# Patient Record
Sex: Female | Born: 1985 | Race: Black or African American | Hispanic: No | Marital: Single | Smoking: Never smoker
Health system: Southern US, Community
[De-identification: ages and names within clinical notes are randomized; demographics above are authoritative.]

## PROBLEM LIST (undated history)

## (undated) ENCOUNTER — Ambulatory Visit (HOSPITAL_COMMUNITY): Admission: EM | Payer: Medicaid Other

## (undated) DIAGNOSIS — Z789 Other specified health status: Secondary | ICD-10-CM

## (undated) HISTORY — PX: OTHER SURGICAL HISTORY: SHX169

## (undated) HISTORY — PX: TONSILLECTOMY: SUR1361

---

## 2008-04-12 ENCOUNTER — Emergency Department (HOSPITAL_COMMUNITY): Admission: EM | Admit: 2008-04-12 | Discharge: 2008-04-12 | Payer: Self-pay | Admitting: Emergency Medicine

## 2009-03-24 ENCOUNTER — Ambulatory Visit (HOSPITAL_COMMUNITY): Admission: RE | Admit: 2009-03-24 | Discharge: 2009-03-24 | Payer: Self-pay | Admitting: Orthopaedic Surgery

## 2010-08-17 ENCOUNTER — Encounter: Payer: Self-pay | Admitting: Maternal & Fetal Medicine

## 2017-12-24 NOTE — L&D Delivery Note (Signed)
Patient is a 32 y.o. now G4P3 s/p NSVD at [redacted]w[redacted]d, who was admitted for SOL.  She progressed without augmentation with AROM at complete dilation and pushed 9 minutes to deliver.  Cord clamping delayed by 30 minutes per patient request then clamped by CNM and cut by FOB.  Placenta intact and spontaneous, bleeding minimal delivered while cord was still attached.  Mom and baby stable prior to transfer to postpartum. She plans on breastfeeding. She is unsure for birth control.  Delivery Note At 11:29 AM a viable and healthy female was delivered via Vaginal, Spontaneous (Presentation: ROA ).  APGAR: 8, 9; weight pending .   Placenta intact and spontaneous, bleeding minimal delivered while cord was still attached. 3V Cord:with no complications.  Anesthesia: Nitrous  Episiotomy: None Lacerations: None Suture Repair: None Est. Blood Loss (mL): 50  Mom to postpartum.  Baby to Couplet care / Skin to Skin.  Sharyon Cable CNM 05/06/2018, 11:58 AM  Please schedule this patient for Postpartum visit in: 4 weeks with the following provider: Any provider For C/S patients schedule nurse incision check in weeks 2 weeks: no Low risk pregnancy complicated by: nothing Delivery mode:  SVD Anticipated Birth Control:  other/unsure PP Procedures needed: none  Schedule Integrated BH visit: no

## 2018-04-30 ENCOUNTER — Inpatient Hospital Stay (HOSPITAL_COMMUNITY)
Admission: AD | Admit: 2018-04-30 | Discharge: 2018-04-30 | Disposition: A | Discharge status: Home / Self Care | Payer: Medicaid - Out of State | Source: Ambulatory Visit | Attending: Obstetrics and Gynecology | Admitting: Obstetrics and Gynecology

## 2018-04-30 ENCOUNTER — Encounter (HOSPITAL_COMMUNITY): Payer: Self-pay

## 2018-04-30 DIAGNOSIS — K625 Hemorrhage of anus and rectum: Secondary | ICD-10-CM | POA: Diagnosis present

## 2018-04-30 DIAGNOSIS — O2243 Hemorrhoids in pregnancy, third trimester: Secondary | ICD-10-CM | POA: Diagnosis not present

## 2018-04-30 DIAGNOSIS — Z3A38 38 weeks gestation of pregnancy: Secondary | ICD-10-CM | POA: Diagnosis not present

## 2018-04-30 DIAGNOSIS — O26893 Other specified pregnancy related conditions, third trimester: Secondary | ICD-10-CM | POA: Diagnosis present

## 2018-04-30 DIAGNOSIS — K644 Residual hemorrhoidal skin tags: Secondary | ICD-10-CM

## 2018-04-30 HISTORY — DX: Other specified health status: Z78.9

## 2018-04-30 MED ORDER — DIPHENHYDRAMINE HCL 50 MG/ML IJ SOLN
INTRAMUSCULAR | Status: AC
  Filled 2018-04-30: qty 1

## 2018-04-30 MED ORDER — OXYCODONE-ACETAMINOPHEN 5-325 MG PO TABS: 1.0000 | ORAL_TABLET | ORAL | 0 refills | Status: DC | PRN

## 2018-04-30 MED ORDER — BETAMETHASONE VALERATE 0.12 % EX FOAM
1.0000 | Freq: Two times a day (BID) | CUTANEOUS | 0 refills | Status: DC
Start: 2018-04-30 — End: 2020-06-30

## 2018-04-30 MED ORDER — OXYCODONE-ACETAMINOPHEN 5-325 MG PO TABS
1.0000 | ORAL_TABLET | Freq: Once | ORAL | Status: AC
  Administered 2018-04-30: 1 via ORAL
  Filled 2018-04-30: qty 1

## 2018-04-30 MED ORDER — NITROGLYCERIN 0.4 % RE OINT: 1.0000 | TOPICAL_OINTMENT | Freq: Two times a day (BID) | RECTAL | 0 refills | Status: DC | PRN

## 2018-04-30 NOTE — Discharge Instructions (Signed)
Hemorrhoids    Hemorrhoids are swollen veins in and around the rectum or anus. Hemorrhoids can cause pain, itching, or bleeding. Most of the time, they do not cause serious problems. They usually get better with diet changes, lifestyle changes, and other home treatments.  Follow these instructions at home:  Eating and drinking  · Eat foods that have fiber, such as whole grains, beans, nuts, fruits, and vegetables. Ask your doctor about taking products that have added fiber (fiber supplements).  · Drink enough fluid to keep your pee (urine) clear or pale yellow.  For Pain and Swelling  · Take a warm-water bath (sitz bath) for 20 minutes to ease pain. Do this 3-4 times a day.  · If directed, put ice on the painful area. It may be helpful to use ice between your warm baths.  ¨ Put ice in a plastic bag.  ¨ Place a towel between your skin and the bag.  ¨ Leave the ice on for 20 minutes, 2-3 times a day.  General instructions  · Take over-the-counter and prescription medicines only as told by your doctor.  ¨ Medicated creams and medicines that are inserted into the anus (suppositories) may be used or applied as told.  · Exercise often.  · Go to the bathroom when you have the urge to poop (to have a bowel movement). Do not wait.  · Avoid pushing too hard (straining) when you poop.  · Keep the butt area dry and clean. Use wet toilet paper or moist paper towels.  · Do not sit on the toilet for a long time.  Contact a doctor if:  · You have any of these:  ¨ Pain and swelling that do not get better with treatment or medicine.  ¨ Bleeding that will not stop.  ¨ Trouble pooping or you cannot poop.  ¨ Pain or swelling outside the area of the hemorrhoids.  This information is not intended to replace advice given to you by your health care provider. Make sure you discuss any questions you have with your health care provider.  Document Released: 09/18/2008 Document Revised: 05/17/2016 Document Reviewed: 08/24/2015  Elsevier  Interactive Patient Education © 2018 [employer]   

## 2018-04-30 NOTE — MAU Note (Addendum)
G4P2 @ 38. Wksga. Here dt rectal pain with bleeding. Was seen in the office and was given hemmoroid cream and tuxs, witch hazel. Denies LOF or bleeding or abd pain.   EFM applied.  2020: provider at bs assessing. Hemorrhoid assessed. Two large Hemorrhoids  noted.   2047: medicated per order   2120: no allergic rxn to po med.   2125: D/c instructions given with pt understanding. Instructed pt to not to eat past midnight just incase put to sleep for procedure next day. Pt verbalizes understanding.

## 2018-04-30 NOTE — MAU Provider Note (Signed)
History     CSN: [id number]  Arrival date and time: 04/30/18 4098   First Provider Initiated Contact with Patient 04/30/18 2015      Chief Complaint  Patient presents with  . Hemorrhoids    bleeding    HPI   [name] is a 32 y.o. female [phone] @ [redacted]w[redacted]d here in MAU with rectal bleeding and painful rectal hemorrhoids . Says she noticed them on Sunday; says that the pain has gotten progressively worse this week. Says she tried tucks, witch hazel, hydrocortisone, and a few other over the counter medications that has not helped at all. Says she has a hard time sleeping and sitting down due to the pain in her rectum. Says she has noticed a few dark red clots in the toilet from her rectum. No vaginal bleeding, + fetal movement.   OB History    Gravida  4   Para  2   Term  2   Preterm  0   AB  1   Living  2     SAB  1   TAB  0   Ectopic  0   Multiple  0   Live Births  2           Past Medical History:  Diagnosis Date  . Medical history non-contributory     Past Surgical History:  Procedure Laterality Date  . TONSILLECTOMY      Family History  Problem Relation Age of Onset  . COPD Maternal Grandmother   . Diabetes Maternal Grandmother     Social History   Tobacco Use  . Smoking status: Never Smoker  . Smokeless tobacco: Former Engineer, water Use Topics  . Alcohol use: Not Currently  . Drug use: Not Currently    Types: Marijuana    Allergies:  Allergies  Allergen Reactions  . Codeine Hives  . Amoxicillin Rash  . Penicillins Rash    No medications prior to admission.   No results found for this or any previous visit (from the past 48 hour(s)).  Review of Systems  Constitutional: Negative for fever.  Gastrointestinal: Positive for constipation (Was constipation Sunday/ Monday).  Genitourinary: Negative for vaginal bleeding and vaginal discharge.   Physical Exam   Blood pressure 119/79, pulse 83, temperature 98.5 F (36.9 C),  temperature source Oral, resp. rate 20, height  (1.676 m), weight 156 lb (70.8 kg), SpO2 98 %.  Physical Exam  Constitutional: She is oriented to person, place, and time. She appears well-developed and well-nourished. She appears distressed.  HENT:  Head: Normocephalic.  GI: Soft.  Genitourinary: Rectal exam shows external hemorrhoid and tenderness.  Genitourinary Comments: 2 non- thrombosed hemorrhoids noted. Small opening with oozing of blood noted from opening. Significant tenderness.   Musculoskeletal: Normal range of motion.  Neurological: She is alert and oriented to person, place, and time.  Skin: Skin is warm.  Psychiatric: Her behavior is normal.   Fetal Tracing: Baseline: 120 bpm Variability: Moderate  Accelerations: 15x15 Decelerations: None Toco: None  MAU Course  Procedures  None  MDM  Percocet given 1 tab PO; patient tolerated well  Discussed patient with Dr. Jolayne Panther including HPI, PE. Plan to consult with General Surgery for out patient management.   Assessment and Plan   A:  Hemorrhoids during pregnancy in third trimester - Plan: Consult to general surgery  Bleeding external hemorrhoids - Plan: Consult to general surgery   P:  Discharge home with strict return precautions If symptoms  worsen go to [facility] or [facility] Rx: Nitro cream external, percocet Outpatient referral to General surgery for management Return to MAU for OB reasons  Kim Salazar, Kim Dike I, NP 05/02/2018 11:18 AM

## 2018-04-30 NOTE — MAU Note (Signed)
Pt reports hemorrhoids and rectal bleeding that started Sunday/Monday, but worse throughout the week. Has tried witch hazel, tucks pads, RX, nothing helps. Last BM was this morning-was normal; had drank some prune juice, which seems to soften to stools, but the pain is worse. Pt is very tearful in triage. Pt denies LOF or vaginal bleeding. Reports some contractions. Reports good fetal movement.

## 2018-05-06 ENCOUNTER — Inpatient Hospital Stay (HOSPITAL_COMMUNITY)
Admission: AD | Admit: 2018-05-06 | Discharge: 2018-05-07 | DRG: 807 | Disposition: A | Discharge status: Home / Self Care | Payer: Medicaid - Out of State | Source: Ambulatory Visit | Attending: Family Medicine | Admitting: Family Medicine

## 2018-05-06 ENCOUNTER — Other Ambulatory Visit: Payer: Self-pay

## 2018-05-06 ENCOUNTER — Encounter (HOSPITAL_COMMUNITY): Payer: Self-pay

## 2018-05-06 DIAGNOSIS — Z3483 Encounter for supervision of other normal pregnancy, third trimester: Secondary | ICD-10-CM | POA: Diagnosis present

## 2018-05-06 DIAGNOSIS — O0930 Supervision of pregnancy with insufficient antenatal care, unspecified trimester: Secondary | ICD-10-CM

## 2018-05-06 DIAGNOSIS — O0933 Supervision of pregnancy with insufficient antenatal care, third trimester: Secondary | ICD-10-CM

## 2018-05-06 DIAGNOSIS — Z880 Allergy status to penicillin: Secondary | ICD-10-CM | POA: Diagnosis not present

## 2018-05-06 DIAGNOSIS — Z3A38 38 weeks gestation of pregnancy: Secondary | ICD-10-CM

## 2018-05-06 DIAGNOSIS — Z91040 Latex allergy status: Secondary | ICD-10-CM

## 2018-05-06 LAB — POCT FERN TEST: POCT Fern Test: NEGATIVE

## 2018-05-06 LAB — RAPID HIV SCREEN (HIV 1/2 AB+AG)
HIV 1/2 Antibodies: NONREACTIVE
HIV-1 P24 Antigen - HIV24: NONREACTIVE

## 2018-05-06 LAB — CBC
HCT: 41.1 % (ref 36.0–46.0)
Hemoglobin: 14 g/dL (ref 12.0–15.0)
MCH: 30.6 pg (ref 26.0–34.0)
MCHC: 34.1 g/dL (ref 30.0–36.0)
MCV: 89.9 fL (ref 78.0–100.0)
Platelets: 146 10*3/uL — ABNORMAL LOW (ref 150–400)
RBC: 4.57 MIL/uL (ref 3.87–5.11)
RDW: 14.3 % (ref 11.5–15.5)
WBC: 11.8 10*3/uL — ABNORMAL HIGH (ref 4.0–10.5)

## 2018-05-06 LAB — TYPE AND SCREEN
ABO/RH(D): AB POS
Antibody Screen: NEGATIVE

## 2018-05-06 LAB — HEPATITIS B SURFACE ANTIGEN: Hepatitis B Surface Ag: NEGATIVE

## 2018-05-06 LAB — GROUP B STREP BY PCR: Group B strep by PCR: NEGATIVE

## 2018-05-06 LAB — ABO/RH: ABO/RH(D): AB POS

## 2018-05-06 MED ORDER — DIBUCAINE 1 % RE OINT: 1.0000 "application " | TOPICAL_OINTMENT | RECTAL | Status: DC | PRN

## 2018-05-06 MED ORDER — WITCH HAZEL-GLYCERIN EX PADS: 1.0000 "application " | MEDICATED_PAD | CUTANEOUS | Status: DC | PRN

## 2018-05-06 MED ORDER — OXYTOCIN 40 UNITS IN LACTATED RINGERS INFUSION - SIMPLE MED
2.5000 [IU]/h | INTRAVENOUS | Status: DC
  Filled 2018-05-06: qty 1000

## 2018-05-06 MED ORDER — SENNOSIDES-DOCUSATE SODIUM 8.6-50 MG PO TABS
2.0000 | ORAL_TABLET | ORAL | Status: DC
  Administered 2018-05-06: 2 via ORAL
  Filled 2018-05-06: qty 2

## 2018-05-06 MED ORDER — DIPHENHYDRAMINE HCL 25 MG PO CAPS: 25.0000 mg | ORAL_CAPSULE | Freq: Four times a day (QID) | ORAL | Status: DC | PRN

## 2018-05-06 MED ORDER — SOD CITRATE-CITRIC ACID 500-334 MG/5ML PO SOLN: 30.0000 mL | ORAL | Status: DC | PRN

## 2018-05-06 MED ORDER — COCONUT OIL OIL
1.0000 "application " | TOPICAL_OIL | Status: DC | PRN
  Administered 2018-05-06: 1 via TOPICAL
  Filled 2018-05-06: qty 120

## 2018-05-06 MED ORDER — ONDANSETRON HCL 4 MG/2ML IJ SOLN: 4.0000 mg | INTRAMUSCULAR | Status: DC | PRN

## 2018-05-06 MED ORDER — ACETAMINOPHEN 325 MG PO TABS
650.0000 mg | ORAL_TABLET | ORAL | Status: DC | PRN
Start: 2018-05-06 — End: 2018-05-07
  Administered 2018-05-06 (×2): 650 mg via ORAL
  Filled 2018-05-06 (×2): qty 2

## 2018-05-06 MED ORDER — ONDANSETRON HCL 4 MG PO TABS: 4.0000 mg | ORAL_TABLET | ORAL | Status: DC | PRN

## 2018-05-06 MED ORDER — TETANUS-DIPHTH-ACELL PERTUSSIS 5-2.5-18.5 LF-MCG/0.5 IM SUSP: 0.5000 mL | Freq: Once | INTRAMUSCULAR | Status: DC

## 2018-05-06 MED ORDER — LACTATED RINGERS IV SOLN: 500.0000 mL | INTRAVENOUS | Status: DC | PRN

## 2018-05-06 MED ORDER — ZOLPIDEM TARTRATE 5 MG PO TABS: 5.0000 mg | ORAL_TABLET | Freq: Every evening | ORAL | Status: DC | PRN

## 2018-05-06 MED ORDER — LACTATED RINGERS IV SOLN
INTRAVENOUS | Status: DC
  Administered 2018-05-06: 10:00:00 via INTRAVENOUS

## 2018-05-06 MED ORDER — SIMETHICONE 80 MG PO CHEW
80.0000 mg | CHEWABLE_TABLET | ORAL | Status: DC | PRN
Start: 2018-05-06 — End: 2018-05-07

## 2018-05-06 MED ORDER — LIDOCAINE HCL (PF) 1 % IJ SOLN
30.0000 mL | INTRAMUSCULAR | Status: DC | PRN
  Filled 2018-05-06: qty 30

## 2018-05-06 MED ORDER — ONDANSETRON HCL 4 MG/2ML IJ SOLN: 4.0000 mg | Freq: Four times a day (QID) | INTRAMUSCULAR | Status: DC | PRN

## 2018-05-06 MED ORDER — PRENATAL MULTIVITAMIN CH
1.0000 | ORAL_TABLET | Freq: Every day | ORAL | Status: DC
  Administered 2018-05-06 – 2018-05-07 (×2): 1 via ORAL
  Filled 2018-05-06 (×2): qty 1

## 2018-05-06 MED ORDER — IBUPROFEN 600 MG PO TABS
600.0000 mg | ORAL_TABLET | Freq: Four times a day (QID) | ORAL | Status: DC
  Administered 2018-05-06 – 2018-05-07 (×3): 600 mg via ORAL
  Filled 2018-05-06 (×4): qty 1

## 2018-05-06 MED ORDER — OXYTOCIN BOLUS FROM INFUSION
500.0000 mL | Freq: Once | INTRAVENOUS | Status: AC
  Administered 2018-05-06: 500 mL via INTRAVENOUS

## 2018-05-06 MED ORDER — BENZOCAINE-MENTHOL 20-0.5 % EX AERO
1.0000 "application " | INHALATION_SPRAY | CUTANEOUS | Status: DC | PRN
  Administered 2018-05-06: 1 via TOPICAL
  Filled 2018-05-06: qty 56

## 2018-05-06 MED ORDER — FENTANYL CITRATE (PF) 100 MCG/2ML IJ SOLN: 100.0000 ug | INTRAMUSCULAR | Status: DC | PRN

## 2018-05-06 MED ORDER — ACETAMINOPHEN 325 MG PO TABS
650.0000 mg | ORAL_TABLET | ORAL | Status: DC | PRN
  Administered 2018-05-06: 650 mg via ORAL
  Filled 2018-05-06: qty 2

## 2018-05-06 NOTE — H&P (Addendum)
OBSTETRIC ADMISSION HISTORY AND PHYSICAL  Kim Salazar is a 32 y.o. female [phone] with IUP at [redacted]w[redacted]d by  presenting for SOL. She reports +FMs, No LOF, no VB, no blurry vision, headaches or peripheral edema, and RUQ pain.  She plans on breast feeding. She request depo for birth control. She received her prenatal care at received care in [place] and recently moved to [facility], no prenatal care for last 2 months aside from MAU visiton 5/8   Dating: By LMP --->  Estimated Date of Delivery: 05/14/18  U/s: Normal per patient report, doesn't know specific details, attempting to acquire records from [place]   Prenatal History/Complications:  Past Medical History: Past Medical History:  Diagnosis Date  . Medical history non-contributory     Past Surgical History: Past Surgical History:  Procedure Laterality Date  . adenoid removal    . TONSILLECTOMY      Obstetrical History: OB History    Gravida  4   Para  2   Term  2   Preterm  0   AB  1   Living  2     SAB  1   TAB  0   Ectopic  0   Multiple  0   Live Births  2           Social History: Social History   Socioeconomic History  . Marital status: Single    Spouse name: Not on file  . Number of children: Not on file  . Years of education: Not on file  . Highest education level: Not on file  Occupational History  . Not on file  Social Needs  . Financial resource strain: Not on file  . Food insecurity:    Worry: Not on file    Inability: Not on file  . Transportation needs:    Medical: Not on file    Non-medical: Not on file  Tobacco Use  . Smoking status: Never Smoker  . Smokeless tobacco: Former Engineer, water and Sexual Activity  . Alcohol use: Not Currently  . Drug use: Not Currently    Types: Marijuana  . Sexual activity: Yes    Birth control/protection: None  Lifestyle  . Physical activity:    Days per week: Not on file    Minutes per session: Not on file  . Stress: Not on file   Relationships  . Social connections:    Talks on phone: Not on file    Gets together: Not on file    Attends religious service: Not on file    Active member of club or organization: Not on file    Attends meetings of clubs or organizations: Not on file    Relationship status: Not on file  Other Topics Concern  . Not on file  Social History Narrative  . Not on file    Family History: Family History  Problem Relation Age of Onset  . COPD Maternal Grandmother   . Diabetes Maternal Grandmother     Allergies: Allergies  Allergen Reactions  . Codeine Hives  . Amoxicillin Rash  . Latex Rash  . Penicillins Rash    Has patient had a PCN reaction causing immediate rash, facial/tongue/throat swelling, SOB or lightheadedness with hypotension: Yes Has patient had a PCN reaction causing severe rash involving mucus membranes or skin necrosis: Yes Has patient had a PCN reaction that required hospitalization: No Has patient had a PCN reaction occurring within the last 10 years: No If all of the  above answers are "NO", then may proceed with Cephalosporin use.     Medications Prior to Admission  Medication Sig Dispense Refill Last Dose  . acetaminophen (TYLENOL) 500 MG tablet Take 500 mg by mouth every 6 (six) hours as needed for mild pain or headache.    05/05/2018 at Unknown time  . oxyCODONE-acetaminophen (PERCOCET/ROXICET) 5-325 MG tablet Take 1-2 tablets by mouth every 4 (four) hours as needed for severe pain. 10 tablet 0 05/05/2018 at Unknown time  . Prenatal Vit-Fe Fumarate-FA (PRENATAL MULTIVITAMIN) TABS tablet Take 1 tablet by mouth daily at 12 noon.   05/05/2018 at Unknown time  . Betamethasone Valerate 0.12 % foam Apply 1 application topically 2 (two) times daily. (Patient not taking: Reported on 05/06/2018) 50 g 0 Not Taking at Unknown time  . Nitroglycerin 0.4 % OINT Place 1 Dose rectally 2 (two) times daily as needed. (Patient not taking: Reported on 05/06/2018) 1 Tube 0 Not Taking  at Unknown time     Review of Systems   All systems reviewed and negative except as stated in HPI  Blood pressure 123/72, pulse 71, temperature 98 F (36.7 C), resp. rate (P) 18, height  (1.676 m), weight 71.2 kg (157 lb). General appearance: alert and cooperative Lungs: clear to auscultation bilaterally Heart: regular rate and rhythm Abdomen: soft, non-tender; bowel sounds normal Extremities: Homans sign is negative, no sign of DVT Presentation: cephalic Fetal monitoringBaseline: 130 bpm, Variability: Fair (1-6 bpm), Accelerations: Reactive and Decelerations: Absent Uterine activityFrequency: Every 3-4 minutes Dilation: 5 Effacement (%): 90 Station: -2 Exam by:: Ninfa Meeker, RN   Prenatal labs: ABO, Rh:   pending Antibody:   pending Rubella:   pending RPR:   pending HBsAg:   pending HIV:   pending GBS:   pending 1 hr Glucola normal per patient report Genetic screening  Normal per patient report Anatomy US normal per patient report  Prenatal Transfer Tool  Maternal Diabetes: No Genetic Screening: Normal Maternal Ultrasounds/Referrals: Normal Fetal Ultrasounds or other Referrals:  None Maternal Substance Abuse:  No Significant Maternal Medications:  None Significant Maternal Lab Results: None  Results for orders placed or performed during the hospital encounter of 05/06/18 (from the past 24 hour(s))  POCT fern test   Collection Time: 05/06/18  9:13 AM  Result Value Ref Range   POCT Fern Test Negative = intact amniotic membranes   CBC   Collection Time: 05/06/18  9:25 AM  Result Value Ref Range   WBC 11.8 (H) 4.0 - 10.5 K/uL   RBC 4.57 3.87 - 5.11 MIL/uL   Hemoglobin 14.0 12.0 - 15.0 g/dL   HCT 16.1 09.6 - 04.5 %   MCV 89.9 78.0 - 100.0 fL   MCH 30.6 26.0 - 34.0 pg   MCHC 34.1 30.0 - 36.0 g/dL   RDW 40.9 81.1 - 91.4 %   Platelets 146 (L) 150 - 400 K/uL    Patient Active Problem List   Diagnosis Date Noted  . Normal labor 05/06/2018     Assessment/Plan:  Kim Salazar is a 32 y.o. [id number] at [redacted]w[redacted]d here for SOL. Patient recently moved from [place], all prenatal records from that office are pending. Everything normal per patient report but also reportedly has not received care anywhere in last two months. Will proceed with all prenatal labs until records are received. Has been changing rapidly, expectant management.  #Labor: expectant #Pain: Iv pain meds #FWB: Cat 1 #ID:  gbs pcr negative #MOF: breast #MOC:depo #Circ:  N//A  Myrene Buddy, MD  05/06/2018, 10:12 AM   I confirm that I have verified the information documented in the resident's note and that I have also personally reperformed the physical exam and all medical decision making activities.  Sharyon Cable, CNM 05/06/18, 12:47 PM

## 2018-05-06 NOTE — MAU Note (Signed)
Pt reports contractions, ? Leaking fluid  

## 2018-05-07 ENCOUNTER — Encounter (HOSPITAL_COMMUNITY): Payer: Self-pay | Admitting: *Deleted

## 2018-05-07 ENCOUNTER — Other Ambulatory Visit: Payer: Self-pay

## 2018-05-07 DIAGNOSIS — O0930 Supervision of pregnancy with insufficient antenatal care, unspecified trimester: Secondary | ICD-10-CM

## 2018-05-07 LAB — RPR: RPR Ser Ql: NONREACTIVE

## 2018-05-07 MED ORDER — IBUPROFEN 600 MG PO TABS: 600.0000 mg | ORAL_TABLET | Freq: Four times a day (QID) | ORAL | 0 refills | Status: DC

## 2018-05-07 NOTE — Lactation Note (Signed)
This note was copied from a baby's chart. Lactation Consultation Note  Patient Name: Kim Salazar [id number] Date: 05/07/2018 Reason for consult: Initial assessment  Visited with P3 experienced breastfeeder.  Baby has been latching well, latch scores of 10.   Encouraged STS and feeding baby on cue. Lactation brochure given to Mom.  Mom aware of IP and OP lactation support available.  Encouraged Mom to call for assist as needed.  Maternal Data Formula Feeding for Exclusion: No Has patient been taught Hand Expression?: Yes Does the patient have breastfeeding experience prior to this delivery?: Yes  Feeding Feeding Type: Breast Fed  LATCH Score Latch: Grasps breast easily, tongue down, lips flanged, rhythmical sucking.  Audible Swallowing: Spontaneous and intermittent  Type of Nipple: Everted at rest and after stimulation  Comfort (Breast/Nipple): Soft / non-tender  Hold (Positioning): No assistance needed to correctly position infant at breast.  LATCH Score: 10  Interventions Interventions: Breast feeding basics reviewed;Skin to skin;Hand express;Breast massage  Consult Status Consult Status: Follow-up Date: 05/08/18 Follow-up type: In-patient    Judee Clara 05/07/2018, 12:17 PM

## 2018-05-07 NOTE — Discharge Summary (Signed)
OB Discharge Summary     Patient Name: Kim Salazar DOB: 1986/07/26 MRN: [id number]  Date of admission: 05/06/2018 Delivering MD: Sharyon Cable   Date of discharge: 05/07/2018  Admitting diagnosis: LABOR Intrauterine pregnancy: [redacted]w[redacted]d     Secondary diagnosis:  Active Problems:   Normal labor   SVD (spontaneous vaginal delivery)   Limited prenatal care  Additional problems: none     Discharge diagnosis: Term Pregnancy Delivered                                                                                                Post partum procedures:none  Augmentation: none  Complications: None  Hospital course:  Onset of Labor With Vaginal Delivery     32 y.o. yo [id number] at [redacted]w[redacted]d was admitted in Active Labor on 05/06/2018. Patient had an uncomplicated labor course as follows:  Membrane Rupture Time/Date: 11:18 AM ,05/06/2018   Intrapartum Procedures: Episiotomy: None [1]                                         Lacerations:  None [1]  Patient had a delivery of a Viable infant. 05/06/2018  Information for the patient's newborn:  Asalee, Barrette [[id number]]  Delivery Method: Vaginal, Spontaneous(Filed from Delivery Summary)    Pateint had an uncomplicated postpartum course.  She is ambulating, tolerating a regular diet, passing flatus, and urinating well. Patient is discharged home in stable condition on 05/07/18.   Physical exam  Vitals:   05/06/18 1353 05/06/18 1512 05/06/18 1821 05/07/18 0534  BP: 110/75 101/80 110/73 110/77  Pulse: 66 85 77 67  Resp:  Temp: 98.4 F (36.9 C) 98 F (36.7 C) (!) 97.5 F (36.4 C) 97.6 F (36.4 C)  TempSrc: Oral Oral Oral Axillary  SpO2: 100% 100% 99%   Weight:      Height:       General: alert, cooperative and no distress Lochia: appropriate Uterine Fundus: firm Incision: N/A DVT Evaluation: No evidence of DVT seen on physical exam. Labs: Lab Results  Component Value Date   WBC 11.8 (H) 05/06/2018   HGB 14.0  05/06/2018   HCT 41.1 05/06/2018   MCV 89.9 05/06/2018   PLT 146 (L) 05/06/2018   No flowsheet data found.  Discharge instruction: per After Visit Summary and "Baby and Me Booklet".  After visit meds:  Allergies as of 05/07/2018      Reactions   Codeine Hives   Amoxicillin Rash   Latex Rash   Penicillins Rash   Has patient had a PCN reaction causing immediate rash, facial/tongue/throat swelling, SOB or lightheadedness with hypotension: Yes Has patient had a PCN reaction causing severe rash involving mucus membranes or skin necrosis: Yes Has patient had a PCN reaction that required hospitalization: No Has patient had a PCN reaction occurring within the last 10 years: No If all of the above answers are "NO", then may proceed with Cephalosporin use.      Medication List  TAKE these medications   acetaminophen 500 MG tablet Commonly known as:  TYLENOL Take 500 mg by mouth every 6 (six) hours as needed for mild pain or headache.   Betamethasone Valerate 0.12 % foam Apply 1 application topically 2 (two) times daily.   ibuprofen 600 MG tablet Commonly known as:  ADVIL,MOTRIN Take 1 tablet (600 mg total) by mouth every 6 (six) hours.   Nitroglycerin 0.4 % Oint Place 1 Dose rectally 2 (two) times daily as needed.   oxyCODONE-acetaminophen 5-325 MG tablet Commonly known as:  PERCOCET/ROXICET Take 1-2 tablets by mouth every 4 (four) hours as needed for severe pain.   prenatal multivitamin Tabs tablet Take 1 tablet by mouth daily at 12 noon.       Diet: routine diet  Activity: Advance as tolerated. Pelvic rest for 6 weeks.   Outpatient follow up:4 weeks Follow up Appt: Future Appointments  Date Time Provider Department Center  06/18/2018  2:55 PM Rolm Bookbinder, CNM [facility]   Follow up Visit:No follow-ups on file.  Postpartum contraception: Depo Provera  Newborn Data: Live born female  Birth Weight: 6 lb 12.3 oz (3070 g) APGAR: 8, 9  Newborn Delivery    Birth date/time:  05/06/2018 11:29:00 Delivery type:  Vaginal, Spontaneous     Baby Feeding: Breast Disposition:home with mother   05/07/2018 Wynelle Bourgeois, CNM

## 2018-05-07 NOTE — Discharge Instructions (Signed)
Vaginal Delivery, Care After °Refer to this sheet in the next few weeks. These instructions provide you with information about caring for yourself after vaginal delivery. Your health care provider may also give you more specific instructions. Your treatment has been planned according to current medical practices, but problems sometimes occur. Call your health care provider if you have any problems or questions. °What can I expect after the procedure? °After vaginal delivery, it is common to have: °· Some bleeding from your vagina. °· Soreness in your abdomen, your vagina, and the area of skin between your vaginal opening and your anus (perineum). °· Pelvic cramps. °· Fatigue. ° °Follow these instructions at home: °Medicines °· Take over-the-counter and prescription medicines only as told by your health care provider. °· If you were prescribed an antibiotic medicine, take it as told by your health care provider. Do not stop taking the antibiotic until it is finished. °Driving ° °· Do not drive or operate heavy machinery while taking prescription pain medicine. °· Do not drive for 24 hours if you received a sedative. °Lifestyle °· Do not drink alcohol. This is especially important if you are breastfeeding or taking medicine to relieve pain. °· Do not use tobacco products, including cigarettes, chewing tobacco, or e-cigarettes. If you need help quitting, ask your health care provider. °Eating and drinking °· Drink at least 8 eight-ounce glasses of water every day unless you are told not to by your health care provider. If you choose to breastfeed your baby, you may need to drink more water than this. °· Eat high-fiber foods every day. These foods may help prevent or relieve constipation. High-fiber foods include: °? Whole grain cereals and breads. °? Brown rice. °? Beans. °? Fresh fruits and vegetables. °Activity °· Return to your normal activities as told by your health care provider. Ask your health care provider  what activities are safe for you. °· Rest as much as possible. Try to rest or take a nap when your baby is sleeping. °· Do not lift anything that is heavier than your baby or 10 lb (4.5 kg) until your health care provider says that it is safe. °· Talk with your health care provider about when you can engage in sexual activity. This may depend on your: °? Risk of infection. °? Rate of healing. °? Comfort and desire to engage in sexual activity. °Vaginal Care °· If you have an episiotomy or a vaginal tear, check the area every day for signs of infection. Check for: °? More redness, swelling, or pain. °? More fluid or blood. °? Warmth. °? Pus or a bad smell. °· Do not use tampons or douches until your health care provider says this is safe. °· Watch for any blood clots that may pass from your vagina. These may look like clumps of dark red, brown, or black discharge. °General instructions °· Keep your perineum clean and dry as told by your health care provider. °· Wear loose, comfortable clothing. °· Wipe from front to back when you use the toilet. °· Ask your health care provider if you can shower or take a bath. If you had an episiotomy or a perineal tear during labor and delivery, your health care provider may tell you not to take baths for a certain length of time. °· Wear a bra that supports your breasts and fits you well. °· If possible, have someone help you with household activities and help care for your baby for at least a few days after   you leave the hospital. °· Keep all follow-up visits for you and your baby as told by your health care provider. This is important. °Contact a health care provider if: °· You have: °? Vaginal discharge that has a bad smell. °? Difficulty urinating. °? Pain when urinating. °? A sudden increase or decrease in the frequency of your bowel movements. °? More redness, swelling, or pain around your episiotomy or vaginal tear. °? More fluid or blood coming from your episiotomy or  vaginal tear. °? Pus or a bad smell coming from your episiotomy or vaginal tear. °? A fever. °? A rash. °? Little or no interest in activities you used to enjoy. °? Questions about caring for yourself or your baby. °· Your episiotomy or vaginal tear feels warm to the touch. °· Your episiotomy or vaginal tear is separating or does not appear to be healing. °· Your breasts are painful, hard, or turn red. °· You feel unusually sad or worried. °· You feel nauseous or you vomit. °· You pass large blood clots from your vagina. If you pass a blood clot from your vagina, save it to show to your health care provider. Do not flush blood clots down the toilet without having your health care provider look at them. °· You urinate more than usual. °· You are dizzy or light-headed. °· You have not breastfed at all and you have not had a menstrual period for 12 weeks after delivery. °· You have stopped breastfeeding and you have not had a menstrual period for 12 weeks after you stopped breastfeeding. °Get help right away if: °· You have: °? Pain that does not go away or does not get better with medicine. °? Chest pain. °? Difficulty breathing. °? Blurred vision or spots in your vision. °? Thoughts about hurting yourself or your baby. °· You develop pain in your abdomen or in one of your legs. °· You develop a severe headache. °· You faint. °· You bleed from your vagina so much that you fill two sanitary pads in one hour. °This information is not intended to replace advice given to you by your health care provider. Make sure you discuss any questions you have with your health care provider. °Document Released: 12/07/2000 Document Revised: 05/23/2016 Document Reviewed: 12/25/2015 °Elsevier Interactive Patient Education © 2018 Elsevier Inc. ° °

## 2018-05-08 LAB — RUBELLA SCREEN: RUBELLA: 1.32 {index} (ref >0.99)

## 2018-06-18 ENCOUNTER — Encounter: Payer: Self-pay | Admitting: Medical

## 2018-06-18 ENCOUNTER — Ambulatory Visit (INDEPENDENT_AMBULATORY_CARE_PROVIDER_SITE_OTHER): Payer: Medicaid - Out of State | Admitting: Medical

## 2018-06-18 VITALS — BP 107/72 | HR 73 | Ht 66.0 in | Wt 138.3 lb

## 2018-06-18 DIAGNOSIS — Z3009 Encounter for other general counseling and advice on contraception: Secondary | ICD-10-CM

## 2018-06-18 DIAGNOSIS — Z3042 Encounter for surveillance of injectable contraceptive: Secondary | ICD-10-CM | POA: Diagnosis not present

## 2018-06-18 DIAGNOSIS — Z1389 Encounter for screening for other disorder: Secondary | ICD-10-CM | POA: Diagnosis not present

## 2018-06-18 LAB — POCT PREGNANCY, URINE: Preg Test, Ur: NEGATIVE

## 2018-06-18 MED ORDER — MEDROXYPROGESTERONE ACETATE 150 MG/ML IM SUSP
150.0000 mg | INTRAMUSCULAR | Status: AC
  Administered 2018-06-18 – 2018-11-19 (×3): 150 mg via INTRAMUSCULAR

## 2018-06-18 NOTE — Progress Notes (Signed)
Subjective:     Kim Salazar is a 32 y.o. female who presents for a postpartum visit. She is 6 weeks postpartum following a spontaneous vaginal delivery. I have fully reviewed the prenatal and intrapartum course. The delivery was at 38/6 gestational weeks. Outcome: spontaneous vaginal delivery. Anesthesia: Nitrous. Postpartum course has been normal. Baby's course has been normal. Baby is feeding by breast. Bleeding no bleeding. Bowel function is normal. Bladder function is normal. Patient is sexually active. Contraception method is condoms. Postpartum depression screening: negative.  The following portions of the patient's history were reviewed and updated as appropriate: allergies, current medications, past family history, past medical history, past social history, past surgical history and problem list.  Review of Systems Pertinent items are noted in HPI.   Objective:    BP 107/72   Pulse 73   Ht 5\' 6"  (1.676 m)   Wt 138 lb 4.8 oz (62.7 kg)   Breastfeeding? Yes   BMI 22.32 kg/m   General:  alert and cooperative   Breasts:  deferred  Lungs: normal respiration  Heart:  normal rate  Abdomen: normal findings: no masses palpable, no organomegaly and soft, non-tender   Vulva:  not evaluated  Vagina: not evaluated  Cervix:  not evaluated  Corpus: not examined  Adnexa:  not evaluated  Rectal Exam: Not performed.         Results for orders placed or performed in visit on 06/18/18 (from the past 24 hour(s))  Pregnancy, urine POC     Status: None   Collection Time: 06/18/18  3:41 PM  Result Value Ref Range   Preg Test, Ur NEGATIVE NEGATIVE    Assessment:     Normal postpartum exam. Pap smear not done at today's visit.  [place] pap [place] with this recent pregnancy in [place], normal.  Unwanted fertility - Depo Provera started today  Plan:    1. Contraception: Depo-Provera injections 2. Follow up in: 12 weeks for  or as needed.    Marny LowensteinWenzel, Taci Sterling N, PA-C 06/18/2018 4:03 PM

## 2018-06-18 NOTE — Progress Notes (Signed)
Pt wants Depo Shot 

## 2018-06-18 NOTE — Patient Instructions (Signed)

## 2018-09-03 ENCOUNTER — Ambulatory Visit: Payer: Medicaid - Out of State

## 2018-09-03 ENCOUNTER — Ambulatory Visit (INDEPENDENT_AMBULATORY_CARE_PROVIDER_SITE_OTHER): Payer: Medicaid - Out of State | Admitting: General Practice

## 2018-09-03 VITALS — BP 109/73 | HR 86 | Ht 66.0 in | Wt 136.0 lb

## 2018-09-03 DIAGNOSIS — Z5189 Encounter for other specified aftercare: Secondary | ICD-10-CM

## 2018-09-03 DIAGNOSIS — Z3042 Encounter for surveillance of injectable contraceptive: Secondary | ICD-10-CM | POA: Diagnosis present

## 2018-09-03 NOTE — Progress Notes (Signed)
Kim Salazar here for Depo-Provera  Injection.  Injection administered without complication. Patient will return in 3 months for next injection.  Marylynn Pearson, RN 09/03/2018  11:22 AM

## 2018-09-04 NOTE — Progress Notes (Signed)
Agree with A & P. 

## 2018-11-19 ENCOUNTER — Ambulatory Visit (INDEPENDENT_AMBULATORY_CARE_PROVIDER_SITE_OTHER): Payer: Medicaid - Out of State | Admitting: *Deleted

## 2018-11-19 VITALS — BP 110/70 | HR 81

## 2018-11-19 DIAGNOSIS — Z3042 Encounter for surveillance of injectable contraceptive: Secondary | ICD-10-CM | POA: Diagnosis not present

## 2018-11-19 DIAGNOSIS — Z3009 Encounter for other general counseling and advice on contraception: Secondary | ICD-10-CM | POA: Diagnosis not present

## 2018-11-19 NOTE — Progress Notes (Signed)
Kim Salazar here for Depo-Provera  Injection.  Injection administered without complication. Patient will return in 3 months for next injection.  Linda,RN 11/19/2018  9:47 AM

## 2019-02-18 ENCOUNTER — Ambulatory Visit: Payer: Medicaid - Out of State

## 2019-02-19 ENCOUNTER — Ambulatory Visit: Payer: Medicaid - Out of State

## 2019-02-20 ENCOUNTER — Ambulatory Visit: Payer: Medicaid - Out of State

## 2019-07-23 ENCOUNTER — Telehealth: Payer: Self-pay

## 2019-07-23 DIAGNOSIS — O3680X0 Pregnancy with inconclusive fetal viability, not applicable or unspecified: Secondary | ICD-10-CM

## 2019-07-23 NOTE — Telephone Encounter (Signed)
Received a call from [facility] from [place] @ [phone] stating that the pt had a positive pregnancy test.  Anderson Malta also stated that off of pt's LMP of 05/15/19 pt should be about [redacted]w[redacted]d.  She stated that she was able to see a intrauterine gestational sac but no yolk sac, no fetal pole.  Pt denied any bleeding or pain.  Anderson Malta states that she is concerned about the size of the gestational sac.  Anderson Malta stated that the pt can reached at two different number at [phone] or [phone].  Notified pt of [place] appt and that she will come over to [place] for results.  I gave pt information to location of [place].  Pt verbalized understanding with no further questions.  OB US scheduled for August 4th @ 0800.  Pt notified.

## 2019-07-28 ENCOUNTER — Telehealth: Payer: Self-pay

## 2019-07-28 ENCOUNTER — Other Ambulatory Visit: Payer: Self-pay

## 2019-07-28 ENCOUNTER — Ambulatory Visit (INDEPENDENT_AMBULATORY_CARE_PROVIDER_SITE_OTHER): Payer: Medicaid - Out of State | Admitting: Student

## 2019-07-28 ENCOUNTER — Ambulatory Visit (HOSPITAL_COMMUNITY)
Admission: RE | Admit: 2019-07-28 | Discharge: 2019-07-28 | Disposition: A | Discharge status: Home / Self Care | Payer: Medicaid - Out of State | Source: Ambulatory Visit | Attending: Family Medicine | Admitting: Family Medicine

## 2019-07-28 ENCOUNTER — Other Ambulatory Visit: Payer: Self-pay | Admitting: Student

## 2019-07-28 ENCOUNTER — Encounter: Payer: Self-pay | Admitting: Student

## 2019-07-28 DIAGNOSIS — O039 Complete or unspecified spontaneous abortion without complication: Secondary | ICD-10-CM | POA: Diagnosis not present

## 2019-07-28 DIAGNOSIS — Z3042 Encounter for surveillance of injectable contraceptive: Secondary | ICD-10-CM | POA: Diagnosis not present

## 2019-07-28 DIAGNOSIS — O3680X0 Pregnancy with inconclusive fetal viability, not applicable or unspecified: Secondary | ICD-10-CM | POA: Insufficient documentation

## 2019-07-28 LAB — CBC
Hematocrit: 41.5 % (ref 34.0–46.6)
Hemoglobin: 13.5 g/dL (ref 11.1–15.9)
MCH: 29.4 pg (ref 26.6–33.0)
MCHC: 32.5 g/dL (ref 31.5–35.7)
MCV: 90 fL (ref 79–97)
Platelets: 221 10*3/uL (ref 150–450)
RBC: 4.59 x10E6/uL (ref 3.77–5.28)
RDW: 12.7 % (ref 11.7–15.4)
WBC: 8.7 10*3/uL (ref 3.4–10.8)

## 2019-07-28 MED ORDER — IBUPROFEN 800 MG PO TABS: 800.0000 mg | ORAL_TABLET | Freq: Three times a day (TID) | ORAL | 1 refills | Status: DC | PRN

## 2019-07-28 MED ORDER — PROMETHAZINE HCL 12.5 MG PO TABS: 12.5000 mg | ORAL_TABLET | Freq: Four times a day (QID) | ORAL | 0 refills | Status: DC | PRN

## 2019-07-28 MED ORDER — MEDROXYPROGESTERONE ACETATE 150 MG/ML IM SUSP
150.0000 mg | Freq: Once | INTRAMUSCULAR | Status: AC
  Administered 2019-07-28: 150 mg via INTRAMUSCULAR

## 2019-07-28 MED ORDER — MISOPROSTOL 200 MCG PO TABS: 800.0000 ug | ORAL_TABLET | Freq: Once | ORAL | 1 refills | Status: DC

## 2019-07-28 MED ORDER — MEDROXYPROGESTERONE ACETATE 150 MG/ML IM SUSP: 150.0000 mg | INTRAMUSCULAR | 0 refills | Status: DC

## 2019-07-28 NOTE — Telephone Encounter (Signed)
Pt called Nurse line regard having Rx sent to the [facility] on [address], when I called the pt she stated that she already picked up Meds. She also wanted to know if her Blood work was back & I advised her that it was not. She also wanted to know if she could get a picture of her US,Advised will ask & call her back.

## 2019-07-28 NOTE — Progress Notes (Signed)
Patient ID: Kim Salazar, female   DOB: 12/16/1986, 33 y.o.   MRN: [id number] History:  Kim Salazar is a 33 y.o. [id number] who presents to clinic today for follow-up results from US. Patient was seen at pregnancy care center for US; she had a and had a gestational sac on 7-30 but no yolk or fetal pole.  Plan was for patient to have US today at 8 am with follow up with provider afterwards.   Patient denies any pain or bleeding right now; she is very anxious for her results.   The following portions of the patient's history were reviewed and updated as appropriate: allergies, current medications, family history, past medical history, social history, past surgical history and problem list.  Review of Systems:  Review of Systems  Constitutional: Negative.   HENT: Negative.   Respiratory: Negative.   Cardiovascular: Negative.   Gastrointestinal: Negative.   Genitourinary: Negative.   Musculoskeletal: Negative.   Skin: Negative.       Objective:  Physical Exam There were no vitals taken for this visit. Physical Exam  Constitutional: She is oriented to person, place, and time. She appears well-developed.  Eyes: Pupils are equal, round, and reactive to light.  Neck: Normal range of motion.  Respiratory: Effort normal.  Neurological: She is alert and oriented to person, place, and time.  Skin: Skin is warm and dry.      Labs and Imaging No results found for this or any previous visit (from the past 24 hour(s)).  [place] Ob Comp Less 14 Wks  Result Date: 07/28/2019 CLINICAL DATA:  Pregnancy of inconclusive fetal viability EXAM: OBSTETRIC <14 WK US AND TRANSVAGINAL OB US TECHNIQUE: Both transabdominal and transvaginal ultrasound examinations were performed for complete evaluation of the gestation as well as the maternal uterus, adnexal regions, and pelvic cul-de-sac. Transvaginal technique was performed to assess early pregnancy. COMPARISON:  None for this gestation FINDINGS: Intrauterine  gestational sac: Present, single Yolk sac:  Not identified Embryo:  Not identified Cardiac Activity: N/A Heart Rate: N/A  bpm MSD: 32.5 mm   8 w   3 d Subchorionic hemorrhage:  Small subchronic hemorrhage Maternal uterus/adnexae: RIGHT ovary normal size and morphology 3.9 x 1.8 x 2.4 cm. LEFT ovary measures 4.2 x 2.7 x 3.9 cm and contains a small corpus luteal cyst. Trace free pelvic fluid. No adnexal masses. IMPRESSION: Large gestational sac within the uterus. No fetal pole or yolk sac visualized. Small subchronic hemorrhage. Findings meet definitive criteria for failed pregnancy. This follows SRU consensus guidelines: Diagnostic Criteria for Nonviable Pregnancy Early in the First Trimester. Macy Mis Engl J Med [phone]. Electronically Signed   By: Ulyses SouthwardMark  Boles M.D.   On: 07/28/2019 09:01   [place] Ob Transvaginal  Result Date: 07/28/2019 CLINICAL DATA:  Pregnancy of inconclusive fetal viability EXAM: OBSTETRIC <14 WK US AND TRANSVAGINAL OB US TECHNIQUE: Both transabdominal and transvaginal ultrasound examinations were performed for complete evaluation of the gestation as well as the maternal uterus, adnexal regions, and pelvic cul-de-sac. Transvaginal technique was performed to assess early pregnancy. COMPARISON:  None for this gestation FINDINGS: Intrauterine gestational sac: Present, single Yolk sac:  Not identified Embryo:  Not identified Cardiac Activity: N/A Heart Rate: N/A  bpm MSD: 32.5 mm   8 w   3 d Subchorionic hemorrhage:  Small subchronic hemorrhage Maternal uterus/adnexae: RIGHT ovary normal size and morphology 3.9 x 1.8 x 2.4 cm. LEFT ovary measures 4.2 x 2.7 x 3.9 cm and contains a small corpus luteal cyst. Trace  free pelvic fluid. No adnexal masses. IMPRESSION: Large gestational sac within the uterus. No fetal pole or yolk sac visualized. Small subchronic hemorrhage. Findings meet definitive criteria for failed pregnancy. This follows SRU consensus guidelines: Diagnostic Criteria for Nonviable  Pregnancy Early in the First Trimester. Alison Stalling J Med [phone]. Electronically Signed   By: Lavonia Dana M.D.   On: 07/28/2019 09:01     Assessment & Plan:  1. Miscarriage -Patient opts for cytotec; has taken before. Will check CBC and then notify her if safe to take cytotec. Will schedule her with provider in two weeks and lab visit in one week; lab visit scheduled and provider visit pending.  -Depo shot today - hCG, quantitative, pregnancy to follow trend in beta HCG.  -Patient declines to see Roselyn Reef; very tearful and upset and wants to leave.  Starr Lake, CNM 07/28/2019 12:25 PM     Early Intrauterine Pregnancy Failure Protocol X  Documented intrauterine pregnancy failure less than or equal to [redacted] weeks   gestation  X  No serious current illness  X  Baseline Hgb greater than or equal to 10g/dl  X  Patient has easily accessible transportation to the hospital  X  Clear preference  X  Practitioner/physician deems patient reliable  X  Counseling by practitioner or physician  X  Patient education by RN  X  Consent form signed       Rho-Gam given by RN if indicated  X  Medication dispensed  X  Cytotec 800 mcg orally  by patient at home       Intravaginally by NP in [facility]U       Rectally by patient at home       Rectally by RN in MAU  X   Ibuprofen 600 mg 1 tablet by mouth every 6 hours as needed #30 - prescribed  X   Tylenol #3 mg by mouth every 4 to 6 hours as needed - prescribed  X   Phenergan 12.5 mg by mouth every 4 hours as needed for nausea - prescribed  Reviewed with pt cytotec procedure.  Pt verbalizes that she lives close to the hospital and has transportation readily available.  Pt appears reliable and verbalizes understanding and agrees with plan of care

## 2019-07-28 NOTE — Patient Instructions (Addendum)
Managing Pregnancy Loss °Pregnancy loss can happen any time during a pregnancy. Often the cause is not known. It is rarely because of anything you did. Pregnancy loss in early pregnancy (during the first trimester) is called a miscarriage. This type of pregnancy loss is the most common. Pregnancy loss that happens after 20 weeks of pregnancy is called fetal demise if the baby's heart stops beating before birth. Fetal demise is much less common. Some women experience spontaneous labor shortly after fetal demise resulting in a stillborn birth (stillbirth). °Any pregnancy loss can be devastating. You will need to recover both physically and emotionally. Most women are able to get pregnant again after a pregnancy loss and deliver a healthy baby. °How to manage emotional recovery ° °Pregnancy loss is very hard emotionally. You may feel many different emotions while you grieve. You may feel sad and angry. You may also feel guilty. It is normal to have periods of crying. Emotional recovery can take longer than physical recovery. It is different for everyone. °Taking these steps can help you in managing this loss: °· Remember that it is unlikely you did anything to cause the pregnancy loss. °· Share your thoughts and feelings with friends, family, and your partner. Remember that your partner is also recovering emotionally. °· Make sure you have a good support system. Do not spend too much time alone. °· Meet with a pregnancy loss counselor or join a pregnancy loss support group. °· Get enough sleep and eat a healthy diet. Return to regular exercise when you have recovered physically. °· Do not use drugs or alcohol to manage your emotions. °· Consider seeing a mental health professional to help you recover emotionally. °· Ask a friend or loved one to help you decide what to do with any clothing and nursery items you received for your baby. °In the case of a stillbirth, many women benefit from taking additional steps in the  grieving process. You may want to: °· Hold your baby after the birth. °· Name your baby. °· Request a birth certificate. °· Create a keepsake such as handprints or footprints. °· Dress your baby and have a picture taken. °· Make funeral arrangements. °· Ask for a baptism or blessing. °Hospitals have staff members who can help you with all these arrangements. °How to recognize emotional stress °It is normal to have emotional stress after a pregnancy loss. But emotional stress that lasts a long time or becomes severe requires treatment. Watch out for these signs of severe emotional stress: °· Sadness, anger, or guilt that is not going away and is interfering with your normal activities. °· Relationship problems that have occurred or gotten worse since the pregnancy loss. °· Signs of depression that last longer than 2 weeks. These may include: °? Sadness. °? Anxiety. °? Hopelessness. °? Loss of interest in activities you enjoy. °? Inability to concentrate. °? Trouble sleeping or sleeping too much. °? Loss of appetite or overeating. °? Thoughts of death or of hurting yourself. °Follow these instructions at home: °· Take over-the-counter and prescription medicines only as told by your health care provider. °· Rest at home until your energy level returns. Return to your normal activities as told by your health care provider. Ask your health care provider what activities are safe for you. °· When you are ready, meet with your health care provider to discuss steps to take for a future pregnancy. °· Keep all follow-up visits as told by your health care provider. This is important. °  Where to find support °· To help you and your partner with the process of grieving, talk with your health care provider or seek counseling. °· Consider meeting with others who have experienced pregnancy loss. Ask your health care provider about support groups and resources. °Where to find more information °· U.S[place] Department of Health and Human  Services Office on Women's Health: www.womenshealth.gov °· American Pregnancy Association: www.americanpregnancy.org °Contact a health care provider if: °· You continue to experience grief, sadness, or lack of motivation for everyday activities, and those feelings do not improve over time. °· You are struggling to recover emotionally, especially if you are using alcohol or substances to help. °Get help right away if: °· You have thoughts of hurting yourself or others. °If you ever feel like you may hurt yourself or others, or have thoughts about taking your own life, get help right away. You can go to your nearest emergency department or call: °· Your local emergency services (911 in the [place] °· A suicide crisis helpline, such as the National Suicide Prevention Lifeline at 1-800-273-8255. This is open 24 hours a day. °Summary °· Any pregnancy loss can be difficult physically and emotionally. °· You may experience many different emotions while you grieve. Emotional recovery can last longer than physical recovery. °· It is normal to have emotional stress after a pregnancy loss. But emotional stress that lasts a long time or becomes severe requires treatment. °· See your health care provider if you are struggling emotionally after a pregnancy loss. °This information is not intended to replace advice given to you by your health care provider. Make sure you discuss any questions you have with your health care provider. °Document Released: 02/20/2018 Document Revised: 04/01/2019 Document Reviewed: 02/20/2018 °Elsevier Patient Education © 2020 Elsevier Inc. ° °

## 2019-07-28 NOTE — Progress Notes (Signed)
Pt here for U/S results. Pt denies pain or bleeding at this time. Curt Bears, CNM notified and will see pt.

## 2019-07-29 ENCOUNTER — Telehealth: Payer: Self-pay | Admitting: General Practice

## 2019-07-29 LAB — BETA HCG QUANT (REF LAB): hCG Quant: 88922 m[IU]/mL

## 2019-07-29 NOTE — Telephone Encounter (Signed)
Patient called and left message on nurse voicemail line stating she cannot see her ultrasound pictures in mychart. Called patient and discussed ultrasound pictures are not viewable in mychart but she can stop by and request those pictures on a CD anytime during office hours. Patient verbalized understanding & asked about bhcg. Told patient Curt Bears will review them when they come back and will send her a mychart message with results. Patient verbalized understanding & had no questions.

## 2019-07-30 ENCOUNTER — Telehealth: Payer: Self-pay

## 2019-07-30 NOTE — Telephone Encounter (Signed)
Pt called requesting a call back.

## 2019-07-31 NOTE — Telephone Encounter (Signed)
Called pt and she was upset stating that she had spoken to the manager about getting [place] pictures.  The pt stated that the manager made her feel bad because she told her "that she would not be able to see anything anyway".  Pt asked who she could speak to about the situation that is above her.  I gave the pt Patient Experience contact information and informed her that she could let them know.  Pt stated "thank you" with no further questions.

## 2019-08-03 ENCOUNTER — Telehealth: Payer: Self-pay

## 2019-08-03 NOTE — Telephone Encounter (Signed)
Pt called requesting to cancel her appt.

## 2019-08-04 ENCOUNTER — Other Ambulatory Visit: Payer: Medicaid - Out of State

## 2019-08-14 ENCOUNTER — Ambulatory Visit: Payer: Medicaid - Out of State | Admitting: Medical

## 2019-08-17 NOTE — Telephone Encounter (Signed)
Pt appt was cancelled

## 2019-08-28 ENCOUNTER — Inpatient Hospital Stay (HOSPITAL_COMMUNITY)
Admission: AD | Admit: 2019-08-28 | Discharge: 2019-08-28 | Discharge status: Against Medical Advice | Payer: Medicaid - Out of State | Attending: Family Medicine | Admitting: Family Medicine

## 2019-08-28 ENCOUNTER — Other Ambulatory Visit: Payer: Self-pay | Admitting: Student

## 2019-08-28 ENCOUNTER — Encounter (HOSPITAL_COMMUNITY): Payer: Self-pay | Admitting: *Deleted

## 2019-08-28 ENCOUNTER — Other Ambulatory Visit: Payer: Self-pay

## 2019-08-28 ENCOUNTER — Inpatient Hospital Stay (HOSPITAL_COMMUNITY): Payer: Medicaid - Out of State

## 2019-08-28 DIAGNOSIS — O034 Incomplete spontaneous abortion without complication: Secondary | ICD-10-CM

## 2019-08-28 DIAGNOSIS — O039 Complete or unspecified spontaneous abortion without complication: Secondary | ICD-10-CM | POA: Diagnosis not present

## 2019-08-28 DIAGNOSIS — Z3A01 Less than 8 weeks gestation of pregnancy: Secondary | ICD-10-CM | POA: Insufficient documentation

## 2019-08-28 DIAGNOSIS — O4690 Antepartum hemorrhage, unspecified, unspecified trimester: Secondary | ICD-10-CM

## 2019-08-28 DIAGNOSIS — Z880 Allergy status to penicillin: Secondary | ICD-10-CM | POA: Diagnosis not present

## 2019-08-28 DIAGNOSIS — R102 Pelvic and perineal pain: Secondary | ICD-10-CM

## 2019-08-28 DIAGNOSIS — O021 Missed abortion: Secondary | ICD-10-CM | POA: Diagnosis not present

## 2019-08-28 LAB — CBC WITH DIFFERENTIAL/PLATELET
Abs Immature Granulocytes: 0.04 10*3/uL (ref 0.00–0.07)
Basophils Absolute: 0.1 10*3/uL (ref 0.0–0.1)
Basophils Relative: 1 %
Eosinophils Absolute: 0.1 10*3/uL (ref 0.0–0.5)
Eosinophils Relative: 1 %
HCT: 40.2 % (ref 36.0–46.0)
Hemoglobin: 13.7 g/dL (ref 12.0–15.0)
Immature Granulocytes: 0 %
Lymphocytes Relative: 17 %
Lymphs Abs: 1.6 10*3/uL (ref 0.7–4.0)
MCH: 30.2 pg (ref 26.0–34.0)
MCHC: 34.1 g/dL (ref 30.0–36.0)
MCV: 88.7 fL (ref 80.0–100.0)
Monocytes Absolute: 0.6 10*3/uL (ref 0.1–1.0)
Monocytes Relative: 6 %
Neutro Abs: 7.1 10*3/uL (ref 1.7–7.7)
Neutrophils Relative %: 75 %
Platelets: 172 10*3/uL (ref 150–400)
RBC: 4.53 MIL/uL (ref 3.87–5.11)
RDW: 12.7 % (ref 11.5–15.5)
WBC: 9.5 10*3/uL (ref 4.0–10.5)
nRBC: 0 % (ref 0.0–0.2)

## 2019-08-28 LAB — COMPREHENSIVE METABOLIC PANEL
ALT: 14 U/L (ref 0–44)
AST: 16 U/L (ref 15–41)
Albumin: 4.1 g/dL (ref 3.5–5.0)
Alkaline Phosphatase: 62 U/L (ref 38–126)
Anion gap: 8 (ref 5–15)
BUN: <5 mg/dL — ABNORMAL LOW (ref 6–20)
CO2: 23 mmol/L (ref 22–32)
Calcium: 9.3 mg/dL (ref 8.9–10.3)
Chloride: 108 mmol/L (ref 98–111)
Creatinine, Ser: 0.65 mg/dL (ref 0.44–1.00)
GFR calc Af Amer: >60 mL/min (ref >60)
GFR calc non Af Amer: >60 mL/min (ref >60)
Glucose, Bld: 98 mg/dL (ref 70–99)
Potassium: 3.4 mmol/L — ABNORMAL LOW (ref 3.5–5.1)
Sodium: 139 mmol/L (ref 135–145)
Total Bilirubin: 0.8 mg/dL (ref 0.3–1.2)
Total Protein: 6.7 g/dL (ref 6.5–8.1)

## 2019-08-28 LAB — HCG, QUANTITATIVE, PREGNANCY: hCG, Beta Chain, Quant, S: 1342 m[IU]/mL — ABNORMAL HIGH (ref <5)

## 2019-08-28 LAB — POC URINE PREG, ED: Preg Test, Ur: POSITIVE — AB

## 2019-08-28 NOTE — Progress Notes (Signed)
I was paged by MAU to assist with pt's child while she went for a necessary ultrasound.  Also offered emotional support as she copes with a threatened miscarriage.    [place], [place]ter Pager, [phone] 2:11 PM

## 2019-08-28 NOTE — MAU Note (Signed)
Kim Salazar, from [place] contacted, is available to come be with child.

## 2019-08-28 NOTE — MAU Note (Signed)
Looking at records, pt was dx with failed preg on 8/4, opted for cytotec, which she did NOT take (another practice advised her not to take it); she was also give depo that day.  Pt did not have any bleeding until this past week.  Other then visit at a different practice, pt did not f/u.

## 2019-08-28 NOTE — MAU Note (Signed)
+  preg test, started bleeding last Friday.  Was given depo 8/6 or 7.

## 2019-08-28 NOTE — MAU Provider Note (Signed)
History     CSN: [id number]  Arrival date and time: 08/28/19 1145   First Provider Initiated Contact with Patient 08/28/19 1406      No chief complaint on file.  [name] is a 33 y.o. [phone] at Unknown who presents to MAU for bleeding and cramping along with a request for an ultrasound. Pt was seen in clinic 07/28/2019 and diagnosed with a failed pregnancy and prescribed Cytotec to pick up at her pharmacy. She was also given a Depo injection on that day in the clinic. Pt reports she did not take the Cytotec and instead, two weeks later, went to a clinic in [place] that told her not to take the Cytotec because they "saw something" and recommended that she give the pregnancy more time to develop.  Pt here today in MAU because of the bleeding and cramping and because she wants to "know what's going on" and is unsure if the bleeding is related to the Depo. Pt reports the bleeding and cramping are minimal and states that she does "not even have the cramping" at this time.  Of note, pt fully dressed when provider entered the room and is declining a physical exam. Pt states "can I just be called with my results because I have been here since 11AM and I have to go." Pt desires to sign AMA paperwork so she can leave prior to lab results being completed.   OB History    Gravida  5   Para  3   Term  3   Preterm  0   AB  1   Living  3     SAB  1   TAB  0   Ectopic  0   Multiple  0   Live Births  3           Past Medical History:  Diagnosis Date  . Medical history non-contributory     Past Surgical History:  Procedure Laterality Date  . adenoid removal    . TONSILLECTOMY      Family History  Problem Relation Age of Onset  . COPD Maternal Grandmother   . Diabetes Maternal Grandmother   . Healthy Mother   . Clotting disorder Father     Social History   Tobacco Use  . Smoking status: Never Smoker  . Smokeless tobacco: Former Network engineer Use Topics   . Alcohol use: Not Currently  . Drug use: Not Currently    Types: Marijuana    Allergies:  Allergies  Allergen Reactions  . Codeine Hives  . Amoxicillin Rash  . Latex Rash  . Penicillins Rash    Has patient had a PCN reaction causing immediate rash, facial/tongue/throat swelling, SOB or lightheadedness with hypotension: Yes Has patient had a PCN reaction causing severe rash involving mucus membranes or skin necrosis: Yes Has patient had a PCN reaction that required hospitalization: No Has patient had a PCN reaction occurring within the last 10 years: No If all of the above answers are "NO", then may proceed with Cephalosporin use.     No medications prior to admission.    Review of Systems  Constitutional: Negative for chills, diaphoresis, fatigue and fever.  Respiratory: Negative for shortness of breath.   Cardiovascular: Negative for chest pain.  Gastrointestinal: Negative for abdominal pain, constipation, diarrhea, nausea and vomiting.  Genitourinary: Positive for vaginal bleeding. Negative for dysuria, flank pain, frequency, pelvic pain, urgency and vaginal discharge.  Neurological: Negative for dizziness, weakness, light-headedness and  headaches.   Physical Exam   Blood pressure 116/75, pulse 84, temperature 99.3 F (37.4 C), temperature source Oral, resp. rate 16, SpO2 99 %, currently breastfeeding.  Patient Vitals for the past 24 hrs:  BP Temp Temp src Pulse Resp SpO2  08/28/19 1256 116/75 - - 84 - -  08/28/19 1153 124/75 99.3 F (37.4 C) Oral 90 16 99 %   Physical Exam  Constitutional: She is oriented to person, place, and time. She appears well-developed and well-nourished. No distress.  HENT:  Head: Normocephalic and atraumatic.  Respiratory: Effort normal.  Neurological: She is alert and oriented to person, place, and time.  Skin: She is not diaphoretic.  Psychiatric: She has a normal mood and affect. Her behavior is normal. Judgment and thought content  normal.   Results for orders placed or performed during the hospital encounter of 08/28/19 (from the past 24 hour(s))  POC Urine Pregnancy[facility])     Status: Abnormal   Collection Time: 08/28/19 12:14 PM  Result Value Ref Range   Preg Test, Ur POSITIVE (A) NEGATIVE  CBC with Differential/Platelet     Status: None   Collection Time: 08/28/19  1:30 PM  Result Value Ref Range   WBC 9.5 4.0 - 10.5 K/uL   RBC 4.53 3.87 - 5.11 MIL/uL   Hemoglobin 13.7 12.0 - 15.0 g/dL   HCT 16.140.2 09.636.0 - 04.546.0 %   MCV 88.7 80.0 - 100.0 fL   MCH 30.2 26.0 - 34.0 pg   MCHC 34.1 30.0 - 36.0 g/dL   RDW 40.912.7 81.111.5 - 91.415.5 %   Platelets 172 150 - 400 K/uL   nRBC 0.0 0.0 - 0.2 %   Neutrophils Relative % 75 %   Neutro Abs 7.1 1.7 - 7.7 K/uL   Lymphocytes Relative 17 %   Lymphs Abs 1.6 0.7 - 4.0 K/uL   Monocytes Relative 6 %   Monocytes Absolute 0.6 0.1 - 1.0 K/uL   Eosinophils Relative 1 %   Eosinophils Absolute 0.1 0.0 - 0.5 K/uL   Basophils Relative 1 %   Basophils Absolute 0.1 0.0 - 0.1 K/uL   Immature Granulocytes 0 %   Abs Immature Granulocytes 0.04 0.00 - 0.07 K/uL  Comprehensive metabolic panel     Status: Abnormal   Collection Time: 08/28/19  1:30 PM  Result Value Ref Range   Sodium 139 135 - 145 mmol/L   Potassium 3.4 (L) 3.5 - 5.1 mmol/L   Chloride 108 98 - 111 mmol/L   CO2 23 22 - 32 mmol/L   Glucose, Bld 98 70 - 99 mg/dL   BUN <5 (L) 6 - 20 mg/dL   Creatinine, Ser 7.820.65 0.44 - 1.00 mg/dL   Calcium 9.3 8.9 - 95.610.3 mg/dL   Total Protein 6.7 6.5 - 8.1 g/dL   Albumin 4.1 3.5 - 5.0 g/dL   AST 16 15 - 41 U/L   ALT 14 0 - 44 U/L   Alkaline Phosphatase 62 38 - 126 U/L   Total Bilirubin 0.8 0.3 - 1.2 mg/dL   GFR calc non Af Amer >60 >60 mL/min   GFR calc Af Amer >60 >60 mL/min   Anion gap 8 5 - 15  hCG, quantitative, pregnancy     Status: Abnormal   Collection Time: 08/28/19  1:30 PM  Result Value Ref Range   hCG, Beta Chain, Quant, S 1,342 (H) <5 mIU/mL   [place] Ob Transvaginal  Result  Date: 08/28/2019 CLINICAL DATA:  Vaginal  bleeding.  Missed abortion. EXAM: TRANSVAGINAL OB ULTRASOUND TECHNIQUE: Transvaginal ultrasound was performed for complete evaluation of the gestation as well as the maternal uterus, adnexal regions, and pelvic cul-de-sac. COMPARISON:  07/28/2019 FINDINGS: Intrauterine gestational sac: Single with irregular sac shape Yolk sac:  Not Visualized. Embryo:  Not Visualized. MSD: 26 mm   7 w   4 d Subchorionic hemorrhage:  None visualized. Maternal uterus/adnexae: Both ovaries are normal in appearance. No mass or abnormal free fluid identified. IMPRESSION: Findings meet definitive criteria for failed IUP, without significant change compared to prior exam. This follows SRU consensus guidelines: Diagnostic Criteria for Nonviable Pregnancy Early in the First Trimester. Macy Mis Engl J Med [phone]. Electronically Signed   By: Danae OrleansJohn A Stahl M.D.   On: 08/28/2019 14:09   MAU Course  Procedures  MDM -failed pregnancy diagnosed 07/28/2019, cytotec not taken as prescribed -patient desiring to leave AMA prior to results returning, but pt was waiting for ultrasound images as the labs resulted and were able to discuss results of labs/US in MAU -CBC w/ Diff: WNL (WBCs 9.5) -CMP: no abnormalities requiring treatment -hCG: 1342 -US: irregular sac shape, no yolk sac or embryo, normal ovaries and uterus, no free fluid, findings meet criteria for failed IUP -discussed results with pt and pt advised to take cytotec as prescribed, pt states she will take cytotec when she gets home today -pt discharged to home in stable condition  Orders Placed This Encounter  Procedures  . US OB Transvaginal    Standing Status:   Standing    Number of Occurrences:   1    Order Specific Question:   Symptom/Reason for Exam    Answer:   Vaginal bleeding in pregnancy [[id number]]  . CBC with Differential/Platelet    Standing Status:   Standing    Number of Occurrences:   1  . Comprehensive metabolic  panel    Standing Status:   Standing    Number of Occurrences:   1  . hCG, quantitative, pregnancy    Standing Status:   Standing    Number of Occurrences:   1  . POC [facility]rine Pregnancy[facility])    Standing Status:   Standing    Number of Occurrences:   1  . Discharge patient    Order Specific Question:   Discharge disposition    Answer:   01-Home or Self Care [1]    Order Specific Question:   Discharge patient date    Answer:   08/28/2019   No orders of the defined types were placed in this encounter.  Assessment and Plan   1. Inevitable abortion   2. Vaginal bleeding in pregnancy   3. Pelvic cramping    Allergies as of 08/28/2019      Reactions   Codeine Hives   Amoxicillin Rash   Latex Rash   Penicillins Rash   Has patient had a PCN reaction causing immediate rash, facial/tongue/throat swelling, SOB or lightheadedness with hypotension: Yes Has patient had a PCN reaction causing severe rash involving mucus membranes or skin necrosis: Yes Has patient had a PCN reaction that required hospitalization: No Has patient had a PCN reaction occurring within the last 10 years: No If all of the above answers are "NO", then may proceed with Cephalosporin use.      Medication List    STOP taking these medications   acetaminophen 500 MG tablet Commonly known as: TYLENOL   prenatal multivitamin Tabs tablet     TAKE  these medications   Betamethasone Valerate 0.12 % foam Apply 1 application topically 2 (two) times daily.   ibuprofen 600 MG tablet Commonly known as: ADVIL Take 1 tablet (600 mg total) by mouth every 6 (six) hours. What changed: Another medication with the same name was removed. Continue taking this medication, and follow the directions you see here.   misoprostol 200 MCG tablet Commonly known as: CYTOTEC Take 4 tablets (800 mcg total) by mouth once for 1 dose.   Nitroglycerin 0.4 % Oint Place 1 Dose rectally 2 (two) times daily as needed.    oxyCODONE-acetaminophen 5-325 MG tablet Commonly known as: PERCOCET/ROXICET Take 1-2 tablets by mouth every 4 (four) hours as needed for severe pain.   promethazine 12.5 MG tablet Commonly known as: PHENERGAN Take 1 tablet (12.5 mg total) by mouth every 6 (six) hours as needed for nausea or vomiting.      -discussed, at length, criteria for failed pregnancy and why patient meets criteria today and on 07/28/2019 -reviewed [place] images with patient and pt requested to photograph images for her keepsake box, as [place] would not provide printed images to patient -discussed normal expectations post-cytotec administration and pt to call clinic or present to MAU with any concerns -discussed risk of infection with prolongation of pregnancy, s/sx and when to return to MAU for evaluation of possible infection -pt advised to pick up other RXs (percocet, ibuprofen, phenergan) previously prescribed for pain/nausea -discussed normal expectations for miscarriage vs. Abnormal bleeding/pain and when to return to MAU -pt discharged to home in stable condition, but pt did not wait for discharge instructions to be printed prior to leaving MAU -message sent to clinic to schedule pt for hCG in 1 week, provider visit in 2 weeks, pt aware  Odie Sera Tyannah Sane 08/28/2019, 3:16 PM

## 2019-08-28 NOTE — Discharge Instructions (Signed)
Miscarriage °A miscarriage is the loss of an unborn baby (fetus) before the 20th week of pregnancy. Most miscarriages happen during the first 3 months of pregnancy. Sometimes, a miscarriage can happen before a woman knows that she is pregnant. °Having a miscarriage can be an emotional experience. If you have had a miscarriage, talk with your health care provider about any questions you may have about miscarrying, the grieving process, and your plans for future pregnancy. °What are the causes? °A miscarriage may be caused by: °· Problems with the genes or chromosomes of the fetus. These problems make it impossible for the baby to develop normally. They are often the result of random errors that occur early in the development of the baby, and are not passed from parent to child (not inherited). °· Infection of the cervix or uterus. °· Conditions that affect hormone balance in the body. °· Problems with the cervix, such as the cervix opening and thinning before pregnancy is at term (cervical insufficiency). °· Problems with the uterus. These may include: °? A uterus with an abnormal shape. °? Fibroids in the uterus. °? Congenital abnormalities. These are problems that were present at birth. °· Certain medical conditions. °· Smoking, drinking alcohol, or using drugs. °· Injury (trauma). °In many cases, the cause of a miscarriage is not known. °What are the signs or symptoms? °Symptoms of this condition include: °· Vaginal bleeding or spotting, with or without cramps or pain. °· Pain or cramping in the abdomen or lower back. °· Passing fluid, tissue, or blood clots from the vagina. °How is this diagnosed? °This condition may be diagnosed based on: °· A physical exam. °· Ultrasound. °· Blood tests. °· Urine tests. °How is this treated? °Treatment for a miscarriage is sometimes not necessary if you naturally pass all the tissue that was in your uterus. If necessary, this condition may be treated with: °· Dilation and  curettage (D&C). This is a procedure in which the cervix is stretched open and the lining of the uterus (endometrium) is scraped. This is done only if tissue from the fetus or placenta remains in the body (incomplete miscarriage). °· Medicines, such as: °? Antibiotic medicine, to treat infection. °? Medicine to help the body pass any remaining tissue. °? Medicine to reduce (contract) the size of the uterus. These medicines may be given if you have a lot of bleeding. °If you have Rh negative blood and your baby was Rh positive, you will need a shot of a medicine called Rh immunoglobulinto protect your future babies from Rh blood problems. "Rh-negative" and "Rh-positive" refer to whether or not the blood has a specific protein found on the surface of red blood cells (Rh factor). °Follow these instructions at home: °Medicines ° °· Take over-the-counter and prescription medicines only as told by your health care provider. °· If you were prescribed antibiotic medicine, take it as told by your health care provider. Do not stop taking the antibiotic even if you start to feel better. °· Do not take NSAIDs, such as aspirin and ibuprofen, unless they are approved by your health care provider. These medicines can cause bleeding. °Activity °· Rest as directed. Ask your health care provider what activities are safe for you. °· Have someone help with home and family responsibilities during this time. °General instructions °· Keep track of the number of sanitary pads you use each day and how soaked (saturated) they are. Write down this information. °· Monitor the amount of tissue or blood clots that   you pass from your vagina. Save any large amounts of tissue for your health care provider to examine. °· Do not use tampons, douche, or have sex until your health care provider approves. °· To help you and your partner with the process of grieving, talk with your health care provider or seek counseling. °· When you are ready, meet with  your health care provider to discuss any important steps you should take for your health. Also, discuss steps you should take to have a healthy pregnancy in the future. °· Keep all follow-up visits as told by your health care provider. This is important. °Where to find more information °· The American Congress of Obstetricians and Gynecologists: www.acog.org °· U.S. Department of Health and Human Services Office of Women’s Health: www.womenshealth.gov °Contact a health care provider if: °· You have a fever or chills. °· You have a foul smelling vaginal discharge. °· You have more bleeding instead of less. °Get help right away if: °· You have severe cramps or pain in your back or abdomen. °· You pass blood clots or tissue from your vagina that is walnut-sized or larger. °· You soak more than 1 regular sanitary pad in an hour. °· You become light-headed or weak. °· You pass out. °· You have feelings of sadness that take over your thoughts, or you have thoughts of hurting yourself. °Summary °· Most miscarriages happen in the first 3 months of pregnancy. Sometimes miscarriage happens before a woman even knows that she is pregnant. °· Follow your health care provider's instruction for home care. Keep all follow-up appointments. °· To help you and your partner with the process of grieving, talk with your health care provider or seek counseling. °This information is not intended to replace advice given to you by your health care provider. Make sure you discuss any questions you have with your health care provider. °Document Released: 06/05/2001 Document Revised: 04/03/2019 Document Reviewed: 01/15/2017 °Elsevier Patient Education © 2020 Elsevier Inc. ° ° °Managing Pregnancy Loss °Pregnancy loss can happen any time during a pregnancy. Often the cause is not known. It is rarely because of anything you did. Pregnancy loss in early pregnancy (during the first trimester) is called a miscarriage. This type of pregnancy loss is  the most common. Pregnancy loss that happens after 20 weeks of pregnancy is called fetal demise if the baby's heart stops beating before birth. Fetal demise is much less common. Some women experience spontaneous labor shortly after fetal demise resulting in a stillborn birth (stillbirth). °Any pregnancy loss can be devastating. You will need to recover both physically and emotionally. Most women are able to get pregnant again after a pregnancy loss and deliver a healthy baby. °How to manage emotional recovery ° °Pregnancy loss is very hard emotionally. You may feel many different emotions while you grieve. You may feel sad and angry. You may also feel guilty. It is normal to have periods of crying. Emotional recovery can take longer than physical recovery. It is different for everyone. °Taking these steps can help you in managing this loss: °· Remember that it is unlikely you did anything to cause the pregnancy loss. °· Share your thoughts and feelings with friends, family, and your partner. Remember that your partner is also recovering emotionally. °· Make sure you have a good support system. Do not spend too much time alone. °· Meet with a pregnancy loss counselor or join a pregnancy loss support group. °· Get enough sleep and eat a healthy diet. Return   to regular exercise when you have recovered physically. °· Do not use drugs or alcohol to manage your emotions. °· Consider seeing a mental health professional to help you recover emotionally. °· Ask a friend or loved one to help you decide what to do with any clothing and nursery items you received for your baby. °In the case of a stillbirth, many women benefit from taking additional steps in the grieving process. You may want to: °· Hold your baby after the birth. °· Name your baby. °· Request a birth certificate. °· Create a keepsake such as handprints or footprints. °· Dress your baby and have a picture taken. °· Make funeral arrangements. °· Ask for a baptism  or blessing. °Hospitals have staff members who can help you with all these arrangements. °How to recognize emotional stress °It is normal to have emotional stress after a pregnancy loss. But emotional stress that lasts a long time or becomes severe requires treatment. Watch out for these signs of severe emotional stress: °· Sadness, anger, or guilt that is not going away and is interfering with your normal activities. °· Relationship problems that have occurred or gotten worse since the pregnancy loss. °· Signs of depression that last longer than 2 weeks. These may include: °? Sadness. °? Anxiety. °? Hopelessness. °? Loss of interest in activities you enjoy. °? Inability to concentrate. °? Trouble sleeping or sleeping too much. °? Loss of appetite or overeating. °? Thoughts of death or of hurting yourself. °Follow these instructions at home: °· Take over-the-counter and prescription medicines only as told by your health care provider. °· Rest at home until your energy level returns. Return to your normal activities as told by your health care provider. Ask your health care provider what activities are safe for you. °· When you are ready, meet with your health care provider to discuss steps to take for a future pregnancy. °· Keep all follow-up visits as told by your health care provider. This is important. °Where to find support °· To help you and your partner with the process of grieving, talk with your health care provider or seek counseling. °· Consider meeting with others who have experienced pregnancy loss. Ask your health care provider about support groups and resources. °Where to find more information °· U.S. Department of Health and Human Services Office on Women's Health: www.womenshealth.gov °· American Pregnancy Association: www.americanpregnancy.org °Contact a health care provider if: °· You continue to experience grief, sadness, or lack of motivation for everyday activities, and those feelings do not  improve over time. °· You are struggling to recover emotionally, especially if you are using alcohol or substances to help. °Get help right away if: °· You have thoughts of hurting yourself or others. °If you ever feel like you may hurt yourself or others, or have thoughts about taking your own life, get help right away. You can go to your nearest emergency department or call: °· Your local emergency services (911 in the [place] °· A suicide crisis helpline, such as the National Suicide Prevention Lifeline at 1-800-273-8255. This is open 24 hours a day. °Summary °· Any pregnancy loss can be difficult physically and emotionally. °· You may experience many different emotions while you grieve. Emotional recovery can last longer than physical recovery. °· It is normal to have emotional stress after a pregnancy loss. But emotional stress that lasts a long time or becomes severe requires treatment. °· See your health care provider if you are struggling emotionally after a pregnancy   loss. This information is not intended to replace advice given to you by your health care provider. Make sure you discuss any questions you have with your health care provider. Document Released: 02/20/2018 Document Revised: 04/01/2019 Document Reviewed: 02/20/2018 Elsevier Patient Education  [employer]. Misoprostol tablets What is this medicine? MISOPROSTOL (mye soe PROST ole) helps to prevent stomach ulcers in patients who take medicines like ibuprofen and aspirin and who are at high risk of complications from ulcers. This medicine may be used for other purposes; ask your health care provider or pharmacist if you have questions. COMMON BRAND NAME(S): Cytotec What should I tell my health care provider before I take this medicine? They need to know if you have any of these conditions:  Crohn's disease  heart disease  kidney disease  ulcerative colitis  an unusual or allergic reaction to misoprostol, prostaglandins,  other medicines, foods, dyes, or preservatives  pregnant or trying to get pregnant  breast-feeding How should I use this medicine? Take this medicine by mouth with a full glass of water. Follow the directions on the prescription label. Take this medicine with food.Take your medicine at regular intervals. Do not take your medicine more often than directed. Talk to your pediatrician regarding the use of this medicine in children. Special care may be needed. Overdosage: If you think you have taken too much of this medicine contact a poison control center or emergency room at once. NOTE: This medicine is only for you. Do not share this medicine with others. What if I miss a dose? If you miss a dose, take it as soon as you can. If it is almost time for your next dose, take only that dose. Do not take double or extra doses. What may interact with this medicine?  antacids This list may not describe all possible interactions. Give your health care provider a list of all the medicines, herbs, non-prescription drugs, or dietary supplements you use. Also tell them if you smoke, drink alcohol, or use illegal drugs. Some items may interact with your medicine. What should I watch for while using this medicine? Do not smoke cigarettes or drink alcohol. These increase irritation to your stomach and can make it more susceptible to damage from medicine like ibuprofen and aspirin. If you are female, do not use this medicine if you are pregnant. Do not get pregnant while taking this medicine and for at least one month (one full menstrual cycle) after stopping this medicine. If you can become pregnant, use a reliable form of birth control while taking this medicine. Talk to your doctor about birth control options. If you do become pregnant, think you are pregnant, or want to become pregnant, immediately call your doctor for advice. What side effects may I notice from receiving this medicine? Side effects that you  should report to your doctor or health care professional as soon as possible:  allergic reactions like skin rash, itching or hives, swelling of the face, lips, or tongue  chest pain  fainting spells  severe diarrhea  sudden shortness of breath  unusual vaginal bleeding, pelvic pain, or cramping Side effects that usually do not require medical attention (report to your doctor or health care professional if they continue or are bothersome):  dizziness  headache  menstrual irregularity, spotting, or cramps  mild diarrhea  nausea  stomach upset or cramps This list may not describe all possible side effects. Call your doctor for medical advice about side effects. You may report side effects  to FDA at 1-800-FDA-1088. Where should I keep my medicine? Keep out of the reach of children. Store at room temperature below 25 degrees C (77 degrees F). Keep in a dry place. Protect from moisture. Throw away any unused medicine after the expiration date. NOTE: This sheet is a summary. It may not cover all possible information. If you have questions about this medicine, talk to your doctor, pharmacist, or health care provider.  2020 Elsevier/Gold Standard (2008-11-23 10:59:53)

## 2019-08-29 ENCOUNTER — Encounter (HOSPITAL_COMMUNITY): Payer: Self-pay

## 2019-08-29 ENCOUNTER — Encounter: Payer: Self-pay | Admitting: Student

## 2019-09-02 ENCOUNTER — Telehealth: Payer: Self-pay | Admitting: General Practice

## 2019-09-02 ENCOUNTER — Other Ambulatory Visit: Payer: Self-pay | Admitting: *Deleted

## 2019-09-02 DIAGNOSIS — O039 Complete or unspecified spontaneous abortion without complication: Secondary | ICD-10-CM

## 2019-09-02 NOTE — Telephone Encounter (Signed)
Patient called and left message on nurse voicemail line stating she is trying to reach [facility]. She states she was only written out for a few days after her hospital visit. Patient states she still isn't feeling well and is still in pain and would like a couple more days out.   Per chart review, patient was recently seen for SAB and given cytotec. Called and discussed with Vernice Jefferson who states patient can be out through Friday and return to work Saturday.   Called & informed patient. Patient verbalized understanding & had no questions.

## 2019-09-04 ENCOUNTER — Other Ambulatory Visit: Payer: Medicaid - Out of State

## 2019-09-04 ENCOUNTER — Telehealth: Payer: Self-pay | Admitting: Obstetrics & Gynecology

## 2019-09-04 NOTE — Telephone Encounter (Signed)
Attempted to reach patient about her missed appointment. 

## 2019-09-14 ENCOUNTER — Ambulatory Visit: Payer: Medicaid - Out of State | Admitting: Women's Health

## 2019-09-24 ENCOUNTER — Other Ambulatory Visit: Payer: Self-pay

## 2019-09-24 ENCOUNTER — Telehealth (INDEPENDENT_AMBULATORY_CARE_PROVIDER_SITE_OTHER): Payer: Medicaid - Out of State | Admitting: Obstetrics and Gynecology

## 2019-09-24 DIAGNOSIS — O039 Complete or unspecified spontaneous abortion without complication: Secondary | ICD-10-CM

## 2019-09-24 NOTE — Progress Notes (Signed)
@  4pm no answer/ lvm to call office to reschedule appointment.

## 2019-09-24 NOTE — Progress Notes (Signed)
46- Called patient, her daughter answered stating she wasn't at home and was at the store but would be back in 5 minutes.

## 2019-09-24 NOTE — Progress Notes (Signed)
Virtual visit today.     Patient had recent SAB in [facility] on 9/4 She is having very minimal bleeding; it has just about stopped. She has had some dizziness however none currently. Has not been able to come to the office d/t schedule, covid.    She needs a f/u Quant ASAP Plans to come in tomorrow for CBC and Hcg.    Lezlie Lye, NP 09/24/2019 4:25 PM

## 2019-09-25 ENCOUNTER — Other Ambulatory Visit: Payer: Self-pay

## 2019-10-09 ENCOUNTER — Other Ambulatory Visit: Payer: Self-pay

## 2019-10-09 ENCOUNTER — Other Ambulatory Visit (INDEPENDENT_AMBULATORY_CARE_PROVIDER_SITE_OTHER): Payer: Medicaid - Out of State | Admitting: Lactation Services

## 2019-10-09 DIAGNOSIS — O039 Complete or unspecified spontaneous abortion without complication: Secondary | ICD-10-CM

## 2019-10-09 NOTE — Progress Notes (Signed)
Spoke with pt while in the room for Beta Hcg. Spoke with Dr. Rosana Hoes who said Beta Hcg can be non stat.   Pt reports she had Depo on Aug 4. She took Cytotec on September and had heavy bleeding for about a week and then had normal bleeding this past week with a positive pregnancy test last night at home. Pt reports minimal cramping and bleeding has stopped now. She reports she is feeling fine.   Pt informed she will be called with results.   Reviewed with pt that if she experiences increased bleeding and is soaking a pad an hour or increased pain that she needs to go to [facility] for evaluation. Pt voiced understanding.

## 2019-10-10 LAB — CBC
Hematocrit: 42.8 % (ref 34.0–46.6)
Hemoglobin: 13.6 g/dL (ref 11.1–15.9)
MCH: 29.6 pg (ref 26.6–33.0)
MCHC: 31.8 g/dL (ref 31.5–35.7)
MCV: 93 fL (ref 79–97)
Platelets: 165 10*3/uL (ref 150–450)
RBC: 4.6 x10E6/uL (ref 3.77–5.28)
RDW: 12.4 % (ref 11.7–15.4)
WBC: 6.8 10*3/uL (ref 3.4–10.8)

## 2019-10-10 LAB — BETA HCG QUANT (REF LAB): hCG Quant: 294 m[IU]/mL

## 2019-10-15 ENCOUNTER — Other Ambulatory Visit: Payer: Self-pay | Admitting: Lactation Services

## 2019-10-15 DIAGNOSIS — O039 Complete or unspecified spontaneous abortion without complication: Secondary | ICD-10-CM

## 2019-10-16 ENCOUNTER — Other Ambulatory Visit: Payer: Self-pay

## 2019-10-16 ENCOUNTER — Other Ambulatory Visit: Payer: Medicaid - Out of State

## 2019-10-16 DIAGNOSIS — O039 Complete or unspecified spontaneous abortion without complication: Secondary | ICD-10-CM

## 2019-10-17 LAB — BETA HCG QUANT (REF LAB): hCG Quant: 250 m[IU]/mL

## 2019-10-21 ENCOUNTER — Telehealth: Payer: Self-pay

## 2019-10-21 NOTE — Telephone Encounter (Signed)
Attempted to call pt. Left VM stating I was calling regarding results from appt on 10/16/19. Notified pt she is scheduled for an appt on 10/23/19 at 0915.   MyChart message sent.

## 2019-10-21 NOTE — Telephone Encounter (Signed)
-----   Message from Sloan Leiter, MD sent at 10/21/2019  1:34 PM EDT ----- Recommend repeat HCG one week, non-stat

## 2019-10-23 ENCOUNTER — Other Ambulatory Visit: Payer: Medicaid - Out of State

## 2019-10-23 ENCOUNTER — Encounter: Payer: Self-pay | Admitting: Obstetrics & Gynecology

## 2019-10-26 ENCOUNTER — Other Ambulatory Visit: Payer: Medicaid - Out of State

## 2019-10-26 ENCOUNTER — Other Ambulatory Visit: Payer: Self-pay

## 2019-10-26 DIAGNOSIS — O039 Complete or unspecified spontaneous abortion without complication: Secondary | ICD-10-CM

## 2019-10-27 LAB — BETA HCG QUANT (REF LAB): hCG Quant: 191 m[IU]/mL

## 2019-10-29 ENCOUNTER — Telehealth (INDEPENDENT_AMBULATORY_CARE_PROVIDER_SITE_OTHER): Payer: Medicaid Other | Admitting: Obstetrics and Gynecology

## 2019-10-29 ENCOUNTER — Other Ambulatory Visit: Payer: Self-pay

## 2019-10-29 ENCOUNTER — Encounter: Payer: Self-pay | Admitting: Obstetrics and Gynecology

## 2019-10-29 DIAGNOSIS — O034 Incomplete spontaneous abortion without complication: Secondary | ICD-10-CM

## 2019-10-29 MED ORDER — IBUPROFEN 600 MG PO TABS: 600.0000 mg | ORAL_TABLET | Freq: Four times a day (QID) | ORAL | 1 refills | Status: DC | PRN

## 2019-10-29 MED ORDER — MISOPROSTOL 200 MCG PO TABS: ORAL_TABLET | ORAL | 0 refills | Status: DC

## 2019-10-29 NOTE — Progress Notes (Signed)
I connected with  Kim Salazar on 10/29/19 at 10:55 AM EST by telephone and verified that I am speaking with the correct person using two identifiers.   I discussed the limitations, risks, security and privacy concerns of performing an evaluation and management service by telephone and the availability of in person appointments. I also discussed with the patient that there may be a patient responsible charge related to this service. The patient expressed understanding and agreed to proceed.  Bethanne Ginger, CMA 10/29/2019  10:45 AM

## 2019-10-29 NOTE — Progress Notes (Signed)
[place] VIRTUAL VIDEO VISIT ENCOUNTER NOTE  Provider location: [facility] [place]   I connected with Kim Salazar on 10/30/19 at 10:55 AM EST by MyChart Video Encounter at home and verified that I am speaking with the correct person using two identifiers.   I discussed the limitations, risks, security and privacy concerns of performing an evaluation and management service virtually and the availability of in person appointments. I also discussed with the patient that there may be a patient responsible charge related to this service. The patient expressed understanding and agreed to proceed.   History:  Kim Salazar is a 33 y.o. [phone] female being evaluated today for follow up for incomplete abortion. She is having minimal to no bleeding and only occasional pain.  Patient seen 07/27/19 in [facility] and diagnosed with non-viable pregnancy. Given cytotec but did not take it at that time (told providers she went to a hospital in [place] who told her to give it more time). Seen 08/28/19 in [facility] for bleeding and again diagnosed with MAB, took cytotec at that time. Did not show for follow up HCG. Seen 09/24/19 and has had slowly decreasing repeat HCG. Now, her HCG is 191.     Past Medical History:  Diagnosis Date  . Medical history non-contributory    Past Surgical History:  Procedure Laterality Date  . adenoid removal    . TONSILLECTOMY     The following portions of the patient's history were reviewed and updated as appropriate: allergies, current medications, past family history, past medical history, past social history, past surgical history and problem list.   Review of Systems:  Pertinent items noted in HPI and remainder of comprehensive ROS otherwise negative.  Physical Exam:   General:  Alert, oriented and cooperative. Patient appears to be in no acute distress.  Mental Status: Normal mood and affect. Normal behavior. Normal judgment and thought content.    Respiratory: Normal respiratory effort, no problems with respiration noted  Rest of physical exam deferred due to type of encounter  Labs and Imaging Results for orders placed or performed in visit on 10/26/19 (from the past 336 hour(s))  Beta hCG quant (ref lab)   Collection Time: 10/26/19  8:35 AM  Result Value Ref Range   hCG Quant 191 mIU/mL  Results for orders placed or performed in visit on 10/16/19 (from the past 336 hour(s))  Beta hCG quant (ref lab)   Collection Time: 10/16/19 10:00 AM  Result Value Ref Range   hCG Quant 250 mIU/mL   No results found.     Assessment and Plan:   1. Incomplete abortion Reviewed options of cytotec versus D&C, reviewed risks/benefits including risks of needing D&C after 2nd cytotec dose, risks of surgery. She would prefer to try cytotec again, she is aware she will need to come to follow up appointments for HCG. Reviewed reasons to present to [facility]. She is agreeable to plan.    I discussed the assessment and treatment plan with the patient. The patient was provided an opportunity to ask questions and all were answered. The patient agreed with the plan and demonstrated an understanding of the instructions.   The patient was advised to call back or seek an in-person evaluation/go to the ED if the symptoms worsen or if the condition fails to improve as anticipated.  I provided 20 minutes of face-to-face time during this encounter.   Sloan Leiter, MD [facility] [place] [place]

## 2019-11-02 ENCOUNTER — Telehealth (INDEPENDENT_AMBULATORY_CARE_PROVIDER_SITE_OTHER): Payer: Medicaid - Out of State | Admitting: Obstetrics and Gynecology

## 2019-11-02 DIAGNOSIS — O039 Complete or unspecified spontaneous abortion without complication: Secondary | ICD-10-CM

## 2019-11-02 NOTE — Telephone Encounter (Signed)
Patient is requesting a note to be out of work because she is passing clots, and bleeding.

## 2019-11-03 ENCOUNTER — Other Ambulatory Visit: Payer: Self-pay | Admitting: Lactation Services

## 2019-11-03 ENCOUNTER — Encounter: Payer: Self-pay | Admitting: General Practice

## 2019-11-03 NOTE — Addendum Note (Signed)
Addended by: Shelly Coss on: 11/03/2019 09:23 AM   Modules accepted: Orders

## 2019-11-03 NOTE — Telephone Encounter (Signed)
Patient called into front office stating she is calling us regarding her pictures sent yesterday. Patient reports she is feeling better now and bleeding has slowed slightly- also reports improvement in pain. Discussed per the picture it looks like she has passed whatever was retained which is good, that's what we were hoping for. Reassured patient pain & bleeding should gradually start improving. Patient verbalized understanding & states she needs a note for work. Patient states she took the medication last Thursday night and hasn't been to work since because she was feeling so terrible with vomiting, diarrhea, & heavy bleeding with severe cramps. Told patient that's not a problem I'll put a note in her chart for the time missing work & she can return next Monday in which she should be feeling much better by then. Patient verbalized understanding & asked when to come Friday for the bloodwork. Told patient to come Friday at 10am. Patient verbalized understanding & had no other questions. Work letter created per Dr Rip Harbour.

## 2019-11-06 ENCOUNTER — Other Ambulatory Visit: Payer: Medicaid - Out of State

## 2019-11-06 ENCOUNTER — Other Ambulatory Visit: Payer: Self-pay

## 2019-11-06 DIAGNOSIS — O039 Complete or unspecified spontaneous abortion without complication: Secondary | ICD-10-CM

## 2019-11-07 LAB — BETA HCG QUANT (REF LAB): hCG Quant: 2 m[IU]/mL

## 2019-11-09 ENCOUNTER — Telehealth: Payer: Self-pay | Admitting: General Practice

## 2019-11-09 NOTE — Telephone Encounter (Signed)
Patient called into front office requesting hcg results from Friday. Informed patient of results and discussed lab has returned to normal value. Patient verbalized understanding & states she wants to know when her next depo is due. Patient reports she has a vial already at home & last injection was 8/4. Told patient injection is past due and offered walk in clinic tonight or appt tomorrow at 830. Patient will come in tomorrow for injection. UPT will not be needed given recent bhcg- informed patient. Patient verbalized understanding to all & had no questions.

## 2019-11-10 ENCOUNTER — Other Ambulatory Visit: Payer: Self-pay

## 2019-11-10 ENCOUNTER — Ambulatory Visit (INDEPENDENT_AMBULATORY_CARE_PROVIDER_SITE_OTHER): Payer: Medicaid Other | Admitting: General Practice

## 2019-11-10 VITALS — BP 111/80 | HR 65 | Ht 66.0 in | Wt 139.0 lb

## 2019-11-10 DIAGNOSIS — Z3042 Encounter for surveillance of injectable contraceptive: Secondary | ICD-10-CM

## 2019-11-10 MED ORDER — MEDROXYPROGESTERONE ACETATE 150 MG/ML IM SUSP
150.0000 mg | Freq: Once | INTRAMUSCULAR | Status: AC
  Administered 2019-11-10: 09:00:00 150 mg via INTRAMUSCULAR

## 2019-11-10 NOTE — Progress Notes (Signed)
Kim Salazar here for Depo-Provera  Injection.  Injection administered without complication. Patient will return in 3 months for next injection. Patient brought her own Depo with her to today's visit.  Derinda Late, RN 11/10/2019  8:43 AM

## 2019-12-17 NOTE — Progress Notes (Signed)
Chart reviewed for nurse visit. Agree with plan of care.   Austyn Perriello Lorraine, CNM 12/17/2019 6:07 PM   

## 2020-01-26 ENCOUNTER — Ambulatory Visit (INDEPENDENT_AMBULATORY_CARE_PROVIDER_SITE_OTHER): Payer: Medicaid Other | Admitting: General Practice

## 2020-01-26 ENCOUNTER — Other Ambulatory Visit: Payer: Self-pay

## 2020-01-26 VITALS — BP 106/74 | HR 60 | Ht 66.0 in | Wt 136.0 lb

## 2020-01-26 DIAGNOSIS — Z3042 Encounter for surveillance of injectable contraceptive: Secondary | ICD-10-CM | POA: Diagnosis not present

## 2020-01-26 MED ORDER — MEDROXYPROGESTERONE ACETATE 150 MG/ML IM SUSP
150.0000 mg | Freq: Once | INTRAMUSCULAR | Status: AC
  Administered 2020-01-26: 10:00:00 150 mg via INTRAMUSCULAR

## 2020-01-26 NOTE — Progress Notes (Signed)
Kim Salazar here for Depo-Provera  Injection.  Injection administered without complication. Patient will return in 3 months for next injection.  Marylynn Pearson, RN 01/26/2020  9:39 AM

## 2020-01-27 NOTE — Progress Notes (Signed)
Patient seen and assessed by nursing staff during this encounter. I have reviewed the chart and agree with the documentation and plan.  Gerrett Loman L Caleen Taaffe, CNM 01/27/2020 9:00 AM   

## 2020-04-12 ENCOUNTER — Encounter: Payer: Self-pay | Admitting: *Deleted

## 2020-04-12 ENCOUNTER — Ambulatory Visit (INDEPENDENT_AMBULATORY_CARE_PROVIDER_SITE_OTHER): Payer: Medicaid Other | Admitting: *Deleted

## 2020-04-12 ENCOUNTER — Other Ambulatory Visit: Payer: Self-pay

## 2020-04-12 VITALS — BP 115/78 | HR 71 | Temp 98.1°F | Ht 66.0 in | Wt 144.1 lb

## 2020-04-12 DIAGNOSIS — Z3042 Encounter for surveillance of injectable contraceptive: Secondary | ICD-10-CM | POA: Diagnosis not present

## 2020-04-12 MED ORDER — MEDROXYPROGESTERONE ACETATE 150 MG/ML IM SUSP
150.0000 mg | Freq: Once | INTRAMUSCULAR | Status: AC
  Administered 2020-04-12: 10:00:00 150 mg via INTRAMUSCULAR

## 2020-04-12 NOTE — Progress Notes (Signed)
Depo Provera 150 mg IM administered as scheduled. Pt tolerated well. Next dose due 7/6-7/20. Pt reports that last pap exam was last year in [place] and was normal. She was advised of need for Annual Gyn exam with next Depo Provera injection. Pt voiced understanding.

## 2020-04-12 NOTE — Progress Notes (Signed)
Patient seen and assessed by nursing staff during this encounter. I have reviewed the chart and agree with the documentation and plan. I have also made any necessary editorial changes.  Thressa Sheller DNP, CNM  04/12/20  11:17 AM

## 2020-06-30 ENCOUNTER — Encounter: Payer: Self-pay | Admitting: Obstetrics and Gynecology

## 2020-06-30 ENCOUNTER — Other Ambulatory Visit: Payer: Self-pay

## 2020-06-30 ENCOUNTER — Ambulatory Visit (INDEPENDENT_AMBULATORY_CARE_PROVIDER_SITE_OTHER): Payer: Medicaid - Out of State | Admitting: Obstetrics and Gynecology

## 2020-06-30 VITALS — BP 111/73 | HR 73 | Ht 66.0 in | Wt 147.0 lb

## 2020-06-30 DIAGNOSIS — Z01419 Encounter for gynecological examination (general) (routine) without abnormal findings: Secondary | ICD-10-CM

## 2020-06-30 DIAGNOSIS — Z3042 Encounter for surveillance of injectable contraceptive: Secondary | ICD-10-CM | POA: Diagnosis not present

## 2020-06-30 MED ORDER — MEDROXYPROGESTERONE ACETATE 150 MG/ML IM SUSP
150.0000 mg | Freq: Once | INTRAMUSCULAR | Status: AC
  Administered 2020-06-30: 150 mg via INTRAMUSCULAR

## 2020-06-30 NOTE — Progress Notes (Signed)
GYNECOLOGY ANNUAL PREVENTATIVE CARE ENCOUNTER NOTE  History:     Kim Salazar is a 34 y.o. [phone] female here for a routine annual gynecologic exam.  Current complaints: none.   Denies abnormal vaginal bleeding, discharge, pelvic pain, problems with intercourse or other gynecologic concerns.    Gynecologic History No LMP recorded (lmp unknown). Patient has had an injection. Contraception: Depo-Provera injections Last Pap: 2017. Results were: normal with negative HPV Last mammogram: NA. No history of maternal breast CA.   Obstetric History OB History  Gravida Para Term Preterm AB Living  5 3 3  0 1 3  SAB TAB Ectopic Multiple Live Births  1 0 0 0 3    # Outcome Date GA Lbr Len/2nd Weight Sex Delivery Anes PTL Lv  5 Gravida           4 SAB           3 Term      Vag-Spont   LIV  2 Term      Vag-Spont   LIV  1 Term      Vag-Spont   LIV    Past Medical History:  Diagnosis Date   Medical history non-contributory     Past Surgical History:  Procedure Laterality Date   adenoid removal     TONSILLECTOMY      Current Outpatient Medications on File Prior to Visit  Medication Sig Dispense Refill   acetaminophen (TYLENOL) 500 MG tablet Take 500 mg by mouth every 6 (six) hours as needed for mild pain or headache.      Cetirizine HCl (ZYRTEC ALLERGY) 10 MG CAPS Take by mouth.     loratadine (CLARITIN) 10 MG tablet Take 10 mg by mouth daily.     Multiple Vitamin (MULTIVITAMIN) capsule Take 1 capsule by mouth daily.     naproxen sodium (ALEVE) 220 MG tablet Take 220 mg by mouth.     No current facility-administered medications on file prior to visit.    Allergies  Allergen Reactions   Codeine Hives   Amoxicillin Rash   Latex Rash   Penicillins Rash    Has patient had a PCN reaction causing immediate rash, facial/tongue/throat swelling, SOB or lightheadedness with hypotension: Yes Has patient had a PCN reaction causing severe rash involving mucus membranes or  skin necrosis: Yes Has patient had a PCN reaction that required hospitalization: No Has patient had a PCN reaction occurring within the last 10 years: No If all of the above answers are "NO", then may proceed with Cephalosporin use.     Social History:  reports that she has never smoked. She has quit using smokeless tobacco. She reports previous alcohol use. She reports previous drug use. Drug: Marijuana.  Family History  Problem Relation Age of Onset   COPD Maternal Grandmother    Diabetes Maternal Grandmother    Healthy Mother    Clotting disorder Father     The following portions of the patient's history were reviewed and updated as appropriate: allergies, current medications, past family history, past medical history, past social history, past surgical history and problem list.  Review of Systems Pertinent items noted in HPI and remainder of comprehensive ROS otherwise negative.  Physical Exam:  BP 111/73    Pulse 73    Ht 5\' 6"  (1.676 m)    Wt 147 lb (66.7 kg)    LMP  (LMP Unknown) Comment: MAY or JUNE   Breastfeeding No    BMI 23.73 kg/m  CONSTITUTIONAL:  Well-developed, well-nourished female in no acute distress.  HENT:  Normocephalic, atraumatic, External right and left ear normal. Oropharynx is clear and moist EYES: Conjunctivae and EOM are normal. Pupils are equal, round, and reactive to light. No scleral icterus.  NECK: Normal range of motion, supple, no masses.  Normal thyroid.  SKIN: Skin is warm and dry. No rash noted. Not diaphoretic. No erythema. No pallor. MUSCULOSKELETAL: Normal range of motion. No tenderness.  No cyanosis, clubbing, or edema.  2+ distal pulses. NEUROLOGIC: Alert and oriented to person, place, and time. Normal reflexes, muscle tone coordination.  PSYCHIATRIC: Normal mood and affect. Normal behavior. Normal judgment and thought content. CARDIOVASCULAR: Normal heart rate noted, regular rhythm RESPIRATORY: Clear to auscultation bilaterally. Effort  and breath sounds normal, no problems with respiration noted. BREASTS: Symmetric in size. No masses, tenderness, skin changes, nipple drainage, or lymphadenopathy bilaterally. Performed in the presence of a chaperone. ABDOMEN: Soft, no distention noted.  No tenderness, rebound or guarding.  PELVIC: Normal appearing external genitalia and urethral meatus,  Normal uterine size, no other palpable masses, no uterine or adnexal tenderness.  Pap not collected today. Performed in the presence of a chaperone.   Assessment and Plan:   1. Well woman exam with routine gynecological exam  - medroxyPROGESTERone (DEPO-PROVERA) injection 150 mg - Patient refuses pap today- concerned about discomfort of exam. Last pap was 4 years ago and was normal. Ok to wait the full 5 years for pap. We discussed premedicating for pap in 1 year or trying a blind collection. Patient was appreciative to be offered the alternatives.  TDAP in 2019 with last pregnancy.     Routine preventative health maintenance measures emphasized. Please refer to After Visit Summary for other counseling recommendations.      Marius Betts, Harolyn Rutherford, NP [facility] [employer], [facility]

## 2020-09-16 ENCOUNTER — Ambulatory Visit: Payer: Medicaid Other

## 2020-09-19 ENCOUNTER — Ambulatory Visit: Payer: Medicaid Other

## 2020-09-20 ENCOUNTER — Other Ambulatory Visit: Payer: Self-pay

## 2020-09-20 ENCOUNTER — Encounter: Payer: Self-pay | Admitting: Lactation Services

## 2020-09-20 ENCOUNTER — Ambulatory Visit (INDEPENDENT_AMBULATORY_CARE_PROVIDER_SITE_OTHER): Payer: Medicaid - Out of State | Admitting: Lactation Services

## 2020-09-20 VITALS — BP 122/78 | HR 77 | Ht 66.0 in | Wt 148.8 lb

## 2020-09-20 DIAGNOSIS — Z3042 Encounter for surveillance of injectable contraceptive: Secondary | ICD-10-CM

## 2020-09-20 MED ORDER — MEDROXYPROGESTERONE ACETATE 150 MG/ML IM SUSP
150.0000 mg | Freq: Once | INTRAMUSCULAR | Status: AC
  Administered 2020-09-20: 150 mg via INTRAMUSCULAR

## 2020-09-20 NOTE — Progress Notes (Signed)
Kim Salazar here for Depo-Provera  Injection.  Injection administered without complication. Patient will return in 3 months for next injection. Patient tolerated well.   Ed Blalock, RN 09/20/2020  11:03 AM

## 2020-12-06 ENCOUNTER — Ambulatory Visit (INDEPENDENT_AMBULATORY_CARE_PROVIDER_SITE_OTHER): Payer: Medicaid - Out of State | Admitting: Lactation Services

## 2020-12-06 ENCOUNTER — Encounter: Payer: Self-pay | Admitting: Lactation Services

## 2020-12-06 ENCOUNTER — Other Ambulatory Visit: Payer: Self-pay

## 2020-12-06 VITALS — BP 119/83 | HR 71 | Ht 66.0 in | Wt 153.1 lb

## 2020-12-06 DIAGNOSIS — Z3042 Encounter for surveillance of injectable contraceptive: Secondary | ICD-10-CM

## 2020-12-06 MED ORDER — MEDROXYPROGESTERONE ACETATE 150 MG/ML IM SUSP
150.0000 mg | Freq: Once | INTRAMUSCULAR | Status: AC
  Administered 2020-12-06: 150 mg via INTRAMUSCULAR

## 2020-12-06 MED ORDER — MEDROXYPROGESTERONE ACETATE 150 MG/ML IM SUSP: 150.0000 mg | INTRAMUSCULAR | 0 refills | Status: DC

## 2020-12-06 NOTE — Progress Notes (Signed)
Kim Salazar here for Depo-Provera Injection. Injection administered without complication. Patient will return in 3 months for next injection between 02/21/2021 and 03/07/2021. Next annual visit due 06/2021.   Ed Blalock, RN 12/06/2020  8:58 AM

## 2020-12-21 ENCOUNTER — Telehealth: Payer: Self-pay | Admitting: Obstetrics & Gynecology

## 2020-12-21 NOTE — Telephone Encounter (Signed)
Attempted to return patients call to inform her she does not need to pick up any medication, that order was entered in error and was cancelled. Patient did not answer and received message that call could not be completed at this time. My Chart message sent.

## 2020-12-21 NOTE — Telephone Encounter (Signed)
Pt calling in to state that she was given Depo 12-06-20 in the office and then pharmacy called pt to pick up rx and it was self injection and pt is wondering why she needs both. Name in rx is Medroxyprogesterone 1050 mg. Pt states insur will not pay for this due to her having Depo in office. Pt would like a call back concerning this..please call [phone]. thanks

## 2020-12-27 ENCOUNTER — Telehealth: Payer: Self-pay | Admitting: Lactation Services

## 2020-12-27 NOTE — Telephone Encounter (Signed)
Patient was given Depo in the office. An order was placed for Depo for Pharmacy and order was cancelled immediately. The Pharmacy filled the prescription and the patient picked it up. Afterwards patient did ask if she should have picked up and I informed her she did not need to pick up, she had already done so.   I called Pharmacy and they are not sure that they received the cancellation order at all and was not able to see although they reported that since it was a few weeks ago it is no longer in he que.  Pharmacy reports they cannot reverse the order at this time and cannot refund patient.   Called patient and she reports she paid for the Depo at the Pharmacy for about $45 and that she is getting a bill from Medicaid in regards to her dose received in the office as Medicaid will not pay for bill generated by the dosage in the office.   Will reach out to management to see if anything can be done. Will reach back out to patient if any information changes.

## 2021-02-21 ENCOUNTER — Ambulatory Visit: Payer: Medicaid - Out of State

## 2021-03-03 ENCOUNTER — Ambulatory Visit (INDEPENDENT_AMBULATORY_CARE_PROVIDER_SITE_OTHER): Payer: Medicaid - Out of State | Admitting: *Deleted

## 2021-03-03 ENCOUNTER — Other Ambulatory Visit: Payer: Self-pay

## 2021-03-03 VITALS — BP 110/73 | HR 67 | Ht 66.0 in | Wt 154.3 lb

## 2021-03-03 DIAGNOSIS — Z3042 Encounter for surveillance of injectable contraceptive: Secondary | ICD-10-CM

## 2021-03-03 MED ORDER — MEDROXYPROGESTERONE ACETATE 150 MG/ML IM SUSP
150.0000 mg | Freq: Once | INTRAMUSCULAR | Status: AC
  Administered 2021-03-03: 150 mg via INTRAMUSCULAR

## 2021-03-03 NOTE — Progress Notes (Signed)
Pt brought medication to appt today which was obtained from her community pharmacy. Depo Provera 150 mg IM administered as scheduled. Pt tolerated well. Next injection due 5/27-6/10. Next Annual Gyn exam w/Pap due 07/01/21. Pt voiced understanding.

## 2021-03-06 NOTE — Progress Notes (Signed)
Patient was assessed and managed by nursing staff during this encounter. I have reviewed the chart and agree with the documentation and plan. I have also made any necessary editorial changes.  Tayvion Lauder, MD 03/06/2021 1:40 AM   

## 2021-04-17 ENCOUNTER — Encounter (HOSPITAL_COMMUNITY): Payer: Self-pay

## 2021-04-17 ENCOUNTER — Emergency Department (HOSPITAL_COMMUNITY)
Admission: EM | Admit: 2021-04-17 | Discharge: 2021-04-17 | Disposition: A | Discharge status: Home / Self Care | Payer: Medicaid Other | Attending: Emergency Medicine | Admitting: Emergency Medicine

## 2021-04-17 ENCOUNTER — Emergency Department (HOSPITAL_COMMUNITY): Payer: Medicaid Other

## 2021-04-17 ENCOUNTER — Other Ambulatory Visit: Payer: Self-pay

## 2021-04-17 ENCOUNTER — Ambulatory Visit (HOSPITAL_COMMUNITY)
Admission: EM | Admit: 2021-04-17 | Discharge: 2021-04-17 | Discharge status: Against Medical Advice | Payer: Medicaid Other

## 2021-04-17 DIAGNOSIS — Z91040 Latex allergy status: Secondary | ICD-10-CM | POA: Diagnosis not present

## 2021-04-17 DIAGNOSIS — S39012A Strain of muscle, fascia and tendon of lower back, initial encounter: Secondary | ICD-10-CM | POA: Diagnosis not present

## 2021-04-17 DIAGNOSIS — S161XXA Strain of muscle, fascia and tendon at neck level, initial encounter: Secondary | ICD-10-CM | POA: Diagnosis not present

## 2021-04-17 DIAGNOSIS — Y92410 Unspecified street and highway as the place of occurrence of the external cause: Secondary | ICD-10-CM | POA: Diagnosis not present

## 2021-04-17 DIAGNOSIS — S199XXA Unspecified injury of neck, initial encounter: Secondary | ICD-10-CM | POA: Diagnosis present

## 2021-04-17 DIAGNOSIS — R519 Headache, unspecified: Secondary | ICD-10-CM | POA: Insufficient documentation

## 2021-04-17 DIAGNOSIS — R0789 Other chest pain: Secondary | ICD-10-CM | POA: Diagnosis not present

## 2021-04-17 DIAGNOSIS — Z87891 Personal history of nicotine dependence: Secondary | ICD-10-CM | POA: Diagnosis not present

## 2021-04-17 MED ORDER — DIPHENHYDRAMINE HCL 25 MG PO CAPS
25.0000 mg | ORAL_CAPSULE | Freq: Once | ORAL | Status: AC
  Administered 2021-04-17: 25 mg via ORAL
  Filled 2021-04-17: qty 1

## 2021-04-17 MED ORDER — ACETAMINOPHEN 325 MG PO TABS
650.0000 mg | ORAL_TABLET | Freq: Once | ORAL | Status: AC
  Administered 2021-04-17: 650 mg via ORAL
  Filled 2021-04-17: qty 2

## 2021-04-17 MED ORDER — METOCLOPRAMIDE HCL 10 MG PO TABS
10.0000 mg | ORAL_TABLET | Freq: Once | ORAL | Status: AC
  Administered 2021-04-17: 10 mg via ORAL
  Filled 2021-04-17: qty 1

## 2021-04-17 MED ORDER — NAPROXEN 500 MG PO TABS: 500.0000 mg | ORAL_TABLET | Freq: Two times a day (BID) | ORAL | 0 refills | Status: DC

## 2021-04-17 MED ORDER — DICLOFENAC SODIUM 1 % EX GEL: 4.0000 g | Freq: Four times a day (QID) | CUTANEOUS | Status: DC
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
## 2021-04-17 NOTE — ED Notes (Signed)
Patient complain of Headache, back and neck pain

## 2021-04-17 NOTE — Discharge Instructions (Addendum)
I am writing a prescription for naproxen as well as Voltaren gel.  Naproxen is a strong anti-inflammatory medication that is similar to Aleve.  You can take this twice a day for management of your pain.  I would also recommend taking Tylenol with this.  I recommend taking 2 regular Tylenol with each naproxen.  I am also prescribing Voltaren gel.  This is an anti-inflammatory gel that you can apply to the regions on your back and neck that are causing you pain.  Please follow the instructions on the box.  Please continue to monitor your symptoms.  If they worsen, please return to the emergency department.  It was a pleasure to meet you.

## 2021-04-18 ENCOUNTER — Other Ambulatory Visit: Payer: Self-pay | Admitting: Chiropractic Medicine

## 2021-04-18 DIAGNOSIS — T148XXA Other injury of unspecified body region, initial encounter: Secondary | ICD-10-CM

## 2021-04-19 ENCOUNTER — Other Ambulatory Visit: Payer: Self-pay

## 2021-04-19 ENCOUNTER — Ambulatory Visit: Admission: RE | Admit: 2021-04-19 | Payer: Self-pay | Source: Ambulatory Visit

## 2021-04-19 ENCOUNTER — Telehealth: Payer: Self-pay | Admitting: *Deleted

## 2021-04-19 NOTE — Telephone Encounter (Signed)
TOC CM received call from pt and states she is having increase in pain. She is requesting pain medication that will help her pain. Explained she will need to follow up with her PCP or if she having an emergency to seek medical attention. Pt verbalized understanding. ED provider that saw pt is not available. Isidoro Donning RN CCM, [facility] ED TOC CM [phone]

## 2021-04-20 ENCOUNTER — Ambulatory Visit (HOSPITAL_COMMUNITY)
Admission: EM | Admit: 2021-04-20 | Discharge: 2021-04-20 | Disposition: A | Discharge status: Home / Self Care | Payer: Self-pay | Attending: Emergency Medicine | Admitting: Emergency Medicine

## 2021-04-20 ENCOUNTER — Encounter (HOSPITAL_COMMUNITY): Payer: Self-pay

## 2021-04-20 DIAGNOSIS — S161XXA Strain of muscle, fascia and tendon at neck level, initial encounter: Secondary | ICD-10-CM

## 2021-04-20 DIAGNOSIS — S39012A Strain of muscle, fascia and tendon of lower back, initial encounter: Secondary | ICD-10-CM

## 2021-04-20 MED ORDER — KETOROLAC TROMETHAMINE 30 MG/ML IJ SOLN
30.0000 mg | Freq: Once | INTRAMUSCULAR | Status: AC
  Administered 2021-04-20: 30 mg via INTRAMUSCULAR

## 2021-04-20 MED ORDER — NAPROXEN 500 MG PO TABS: 500.0000 mg | ORAL_TABLET | Freq: Two times a day (BID) | ORAL | 0 refills | Status: DC

## 2021-04-20 MED ORDER — TIZANIDINE HCL 4 MG PO TABS: 4.0000 mg | ORAL_TABLET | Freq: Three times a day (TID) | ORAL | 0 refills | Status: DC | PRN

## 2021-04-20 MED ORDER — KETOROLAC TROMETHAMINE 30 MG/ML IJ SOLN
INTRAMUSCULAR | Status: AC
  Filled 2021-04-20: qty 1

## 2021-04-20 MED ORDER — ACETAMINOPHEN 325 MG PO TABS
975.0000 mg | ORAL_TABLET | Freq: Once | ORAL | Status: AC
  Administered 2021-04-20: 975 mg via ORAL

## 2021-04-20 MED ORDER — PREDNISONE 20 MG PO TABS: 40.0000 mg | ORAL_TABLET | Freq: Every day | ORAL | 0 refills | Status: AC

## 2021-04-20 MED ORDER — ACETAMINOPHEN 325 MG PO TABS
ORAL_TABLET | ORAL | Status: AC
  Filled 2021-04-20: qty 3

## 2021-04-20 NOTE — Discharge Instructions (Addendum)
Zanaflex, prednisone 40 mg for 5 days, Aleve 500 mg combined with 1000 g of Tylenol twice a day.  May take an additional 1000 mg of Tylenol 2 more times a day as needed for pain.  Deep tissue massage may be helpful.  Follow-up with your doctor or orthopedics if not better in a week or 2.
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
## 2021-04-25 ENCOUNTER — Other Ambulatory Visit: Payer: Medicaid Other

## 2021-04-26 ENCOUNTER — Telehealth (HOSPITAL_COMMUNITY): Payer: Self-pay | Admitting: Emergency Medicine

## 2021-04-26 MED ORDER — METHOCARBAMOL 500 MG PO TABS: 750.0000 mg | ORAL_TABLET | Freq: Three times a day (TID) | ORAL | 0 refills | Status: AC | PRN

## 2021-04-26 NOTE — Telephone Encounter (Signed)
Patient called stating her prescription for muscle relaxers is upsetting her stomach and she is unable to take them.  Asking for another alternative.  Dr. Chaney Malling reviewed and new prescription sent, patient updated and verbalized understandingrobaxin 750 mg 2 tabs q 8 hr x 3 days #18

## 2021-05-19 ENCOUNTER — Ambulatory Visit: Payer: Medicaid - Out of State

## 2021-05-26 ENCOUNTER — Ambulatory Visit (INDEPENDENT_AMBULATORY_CARE_PROVIDER_SITE_OTHER): Payer: Medicaid Other | Admitting: *Deleted

## 2021-05-26 ENCOUNTER — Other Ambulatory Visit: Payer: Self-pay

## 2021-05-26 VITALS — BP 110/75 | HR 82 | Ht 66.0 in | Wt 162.0 lb

## 2021-05-26 DIAGNOSIS — Z3042 Encounter for surveillance of injectable contraceptive: Secondary | ICD-10-CM

## 2021-05-26 MED ORDER — MEDROXYPROGESTERONE ACETATE 150 MG/ML IM SUSP
150.0000 mg | Freq: Once | INTRAMUSCULAR | Status: AC
Start: 2021-05-26 — End: 2021-05-26
  Administered 2021-05-26: 150 mg via INTRAMUSCULAR

## 2021-05-26 NOTE — Progress Notes (Signed)
Patient seen and assessed by nursing staff.  Agree with documentation and plan.  

## 2021-05-26 NOTE — Progress Notes (Signed)
Zella Ball Glaze here for Depo-Provera Injection. Injection administered without complication. Patient will return in 3 months for next injection between 08/11/21 and 08/25/21. Next annual visit due 06/30/21. Advised is due for annual exam with pap in July before next depo-provera.  Atari Novick,RN 05/26/2021  8:33 AM

## 2021-06-30 ENCOUNTER — Ambulatory Visit: Payer: Medicaid Other | Admitting: Medical

## 2021-07-25 IMAGING — US US OB TRANSVAGINAL
1 series · 15 of 28 positions shown · non-contrast
Comparison: 07/28/2019

CLINICAL DATA: Vaginal bleeding.  Missed abortion.

EXAM:
TRANSVAGINAL OB ULTRASOUND
TECHNIQUE: Transvaginal ultrasound was performed for complete evaluation of the
gestation as well as the maternal uterus, adnexal regions, and
pelvic cul-de-sac.

[Series 1: us ob transvaginal · 15 of 33 slices shown]
[im 1/33]
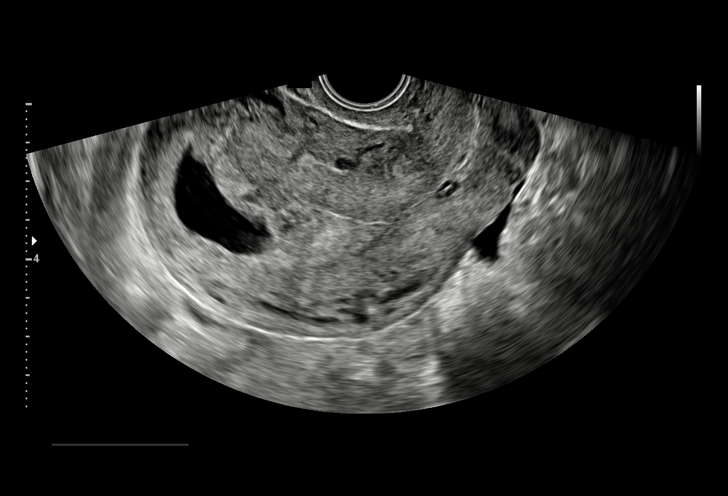
[im 3/33]
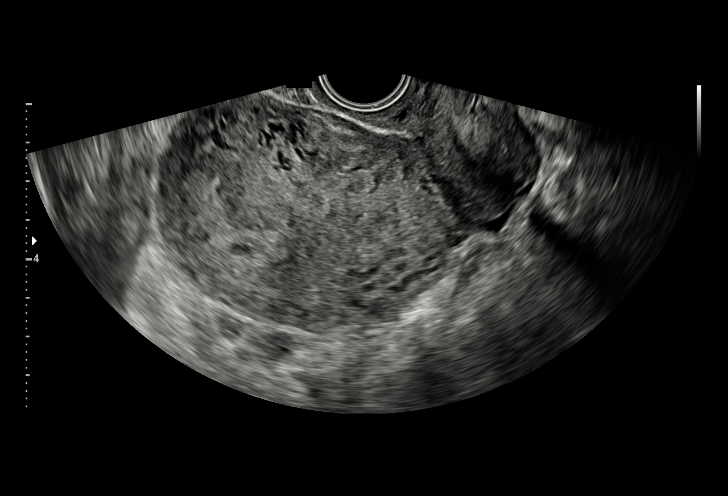
[im 5/33]
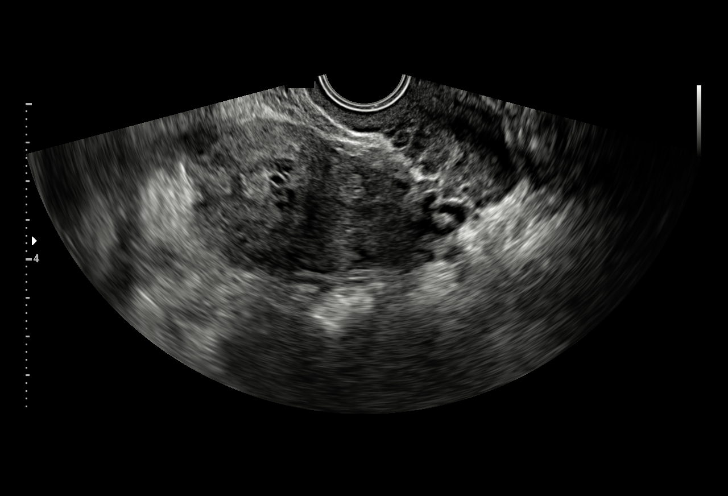
[im 8/33]
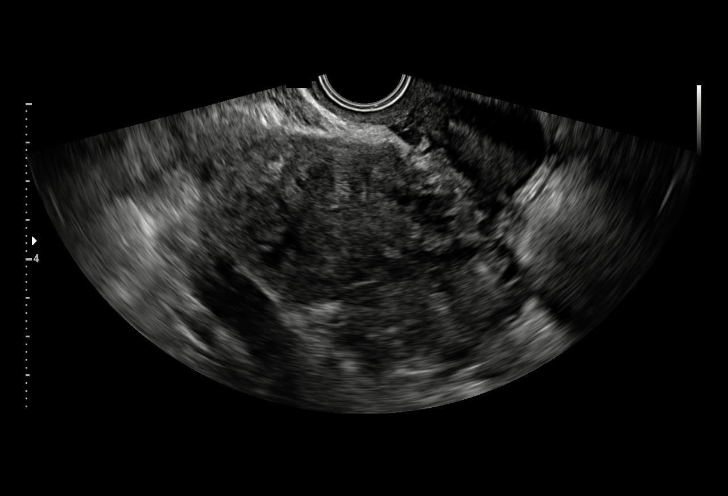
[im 10/33]
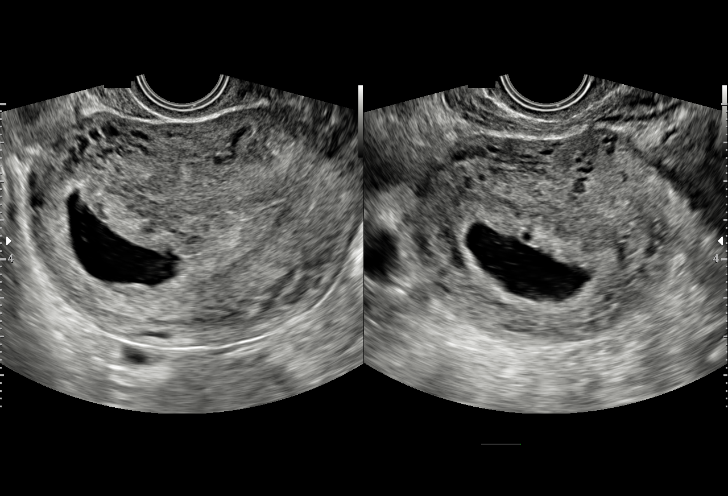
[im 12/33]
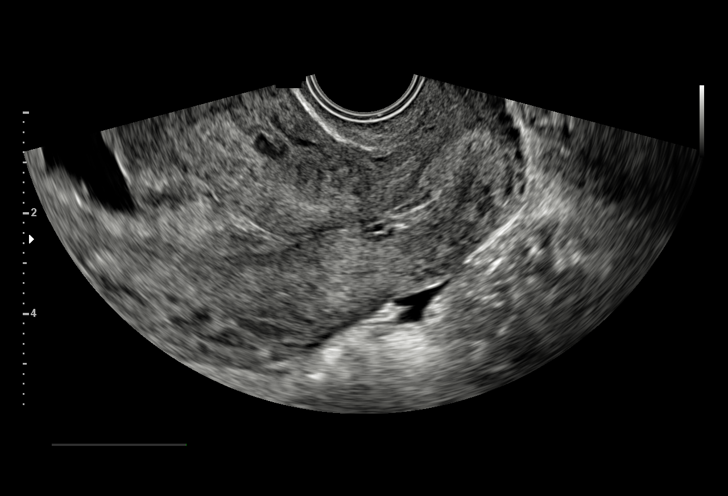
[im 15/33]
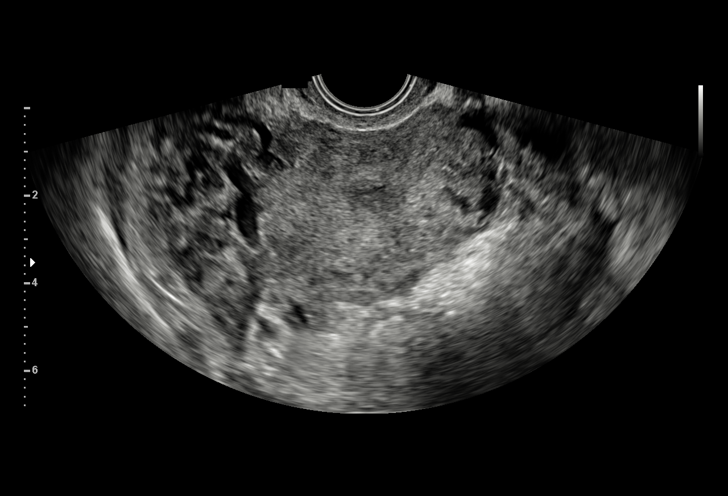
[im 17/33]
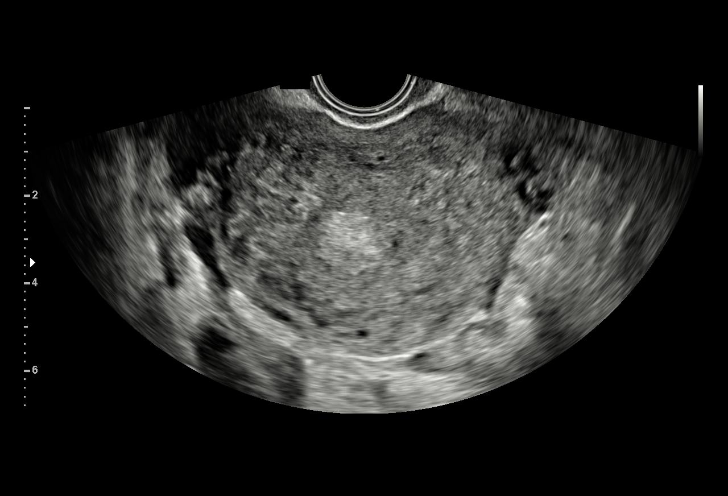
[im 18/33]
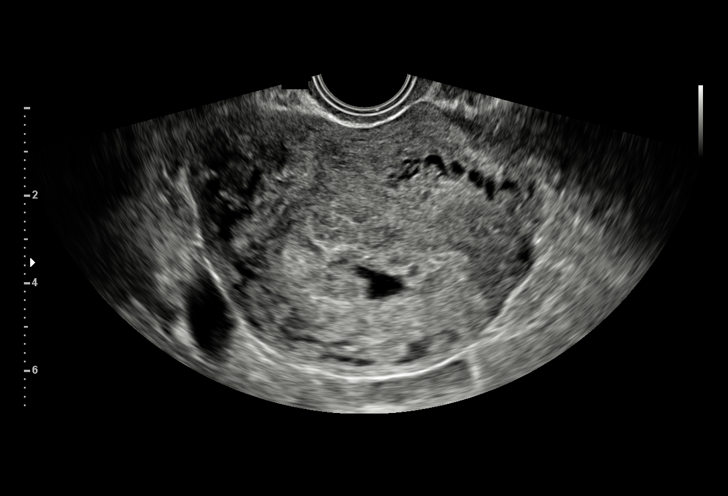
[im 21/33]
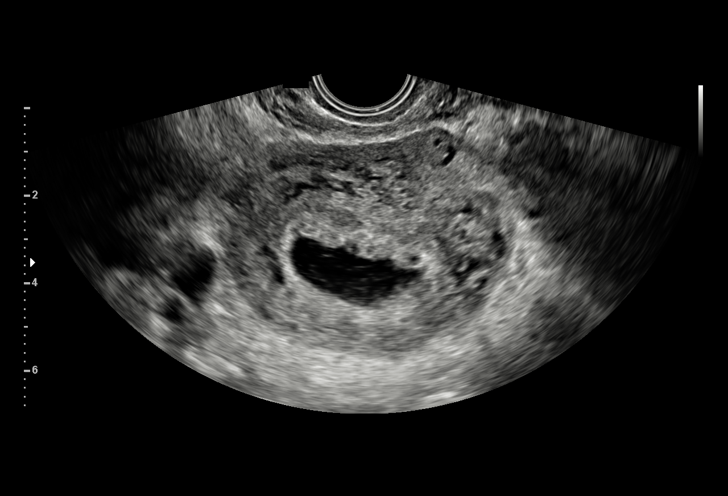
[im 23/33]
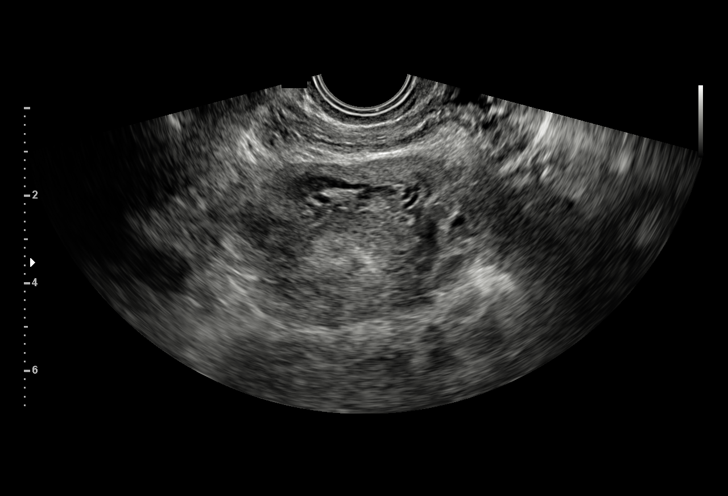
[im 25/33]
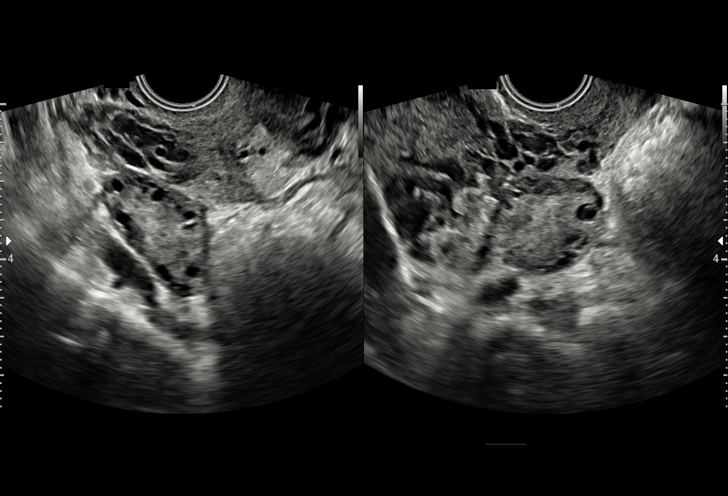
[im 28/33]
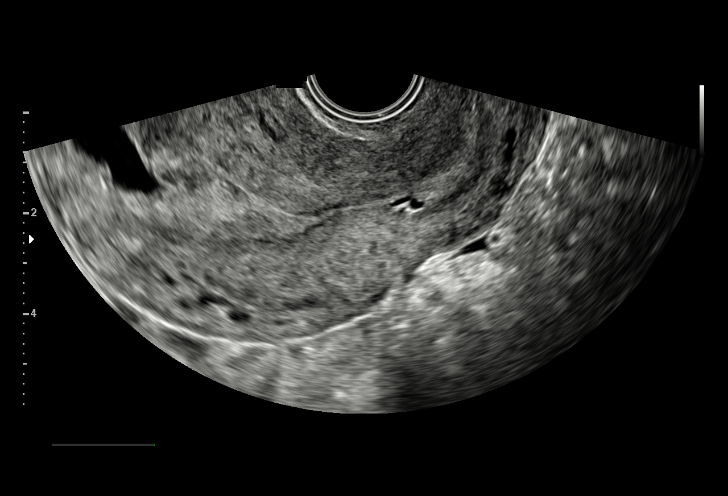
[im 30/33]
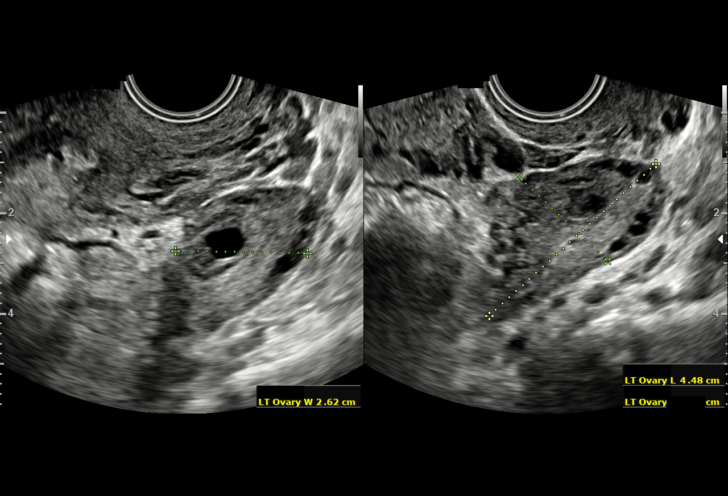
[im 33/33]
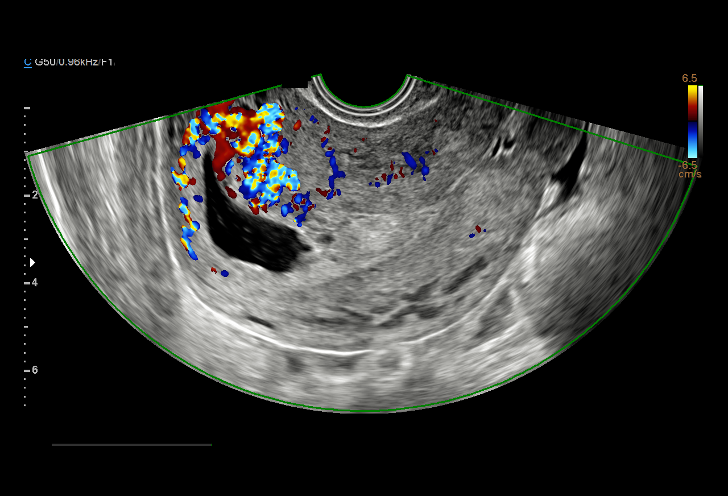

[15 of 28 positions shown; findings below may reference images not displayed]

FINDINGS: Intrauterine gestational sac: Single with irregular sac shape

Yolk sac:  Not Visualized.

Embryo:  Not Visualized.

MSD: 26 mm   7 w   4 d

Subchorionic hemorrhage:  None visualized.

Maternal uterus/adnexae: Both ovaries are normal in appearance. No
mass or abnormal free fluid identified.
IMPRESSION: Findings meet definitive criteria for failed IUP, without
significant change compared to prior exam. This follows SRU
consensus guidelines: Diagnostic Criteria for Nonviable Pregnancy
Early in the First Trimester. N Engl J Med 9927;[DATE].

## 2021-08-11 ENCOUNTER — Ambulatory Visit: Payer: Medicaid Other
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
## 2023-07-24 ENCOUNTER — Ambulatory Visit (INDEPENDENT_AMBULATORY_CARE_PROVIDER_SITE_OTHER): Payer: Medicaid Other | Admitting: Podiatry

## 2023-07-24 ENCOUNTER — Encounter: Payer: Self-pay | Admitting: Podiatry

## 2023-07-24 DIAGNOSIS — Z79899 Other long term (current) drug therapy: Secondary | ICD-10-CM | POA: Diagnosis not present

## 2023-07-24 MED ORDER — TERBINAFINE HCL 250 MG PO TABS: 250.0000 mg | ORAL_TABLET | Freq: Every day | ORAL | 0 refills | Status: DC

## 2023-07-24 NOTE — Progress Notes (Signed)
Subjective:   Patient ID: Kim Salazar, female   DOB: 37 y.o.   MRN: [id number]   HPI Patient states she has been to several podiatrist she was on oral medicine around 20 years ago which helped for a period of time she does not like the nailbed she did soak an apple vinegar and developed some irritation around the right big toenail also with the left big toenail being the one affected.  Patient does not smoke likes to be active   Review of Systems  All other systems reviewed and are negative.       Objective:  Physical Exam Vitals and nursing note reviewed.  Constitutional:      Appearance: She is well-developed.  Pulmonary:     Effort: Pulmonary effort is normal.  Musculoskeletal:        General: Normal range of motion.  Skin:    General: Skin is warm.  Neurological:     Mental Status: She is alert.     Neurovascular status intact muscle strength adequate range of motion adequate with patient found to have a thickened hallux nail bed left with dystrophic changes associated with it no pain right hallux nail is slightly irritated skin around the nailbed and she did soak this also in apple cider vinegar.  Good digital perfusion well-oriented      Assessment:  Probability that there is fungal infection but also trauma of the left big toenail as it has been present for a long time with probability for skin irritation from site or apple vinegar right     Plan:  H&P reviewed both conditions she is very aggressive to wanting to get it better so we Argun to start her on oral medicine again as it has been 20 years and I explained the risk associated with this and I am sending for liver function studies.  She also wants to pursue laser and she will start that in the next few weeks I did explain risk factors with that and the fact that we can do all of this and still the nail can be damaged and not review turned to its normal color or shape.  All questions answered today and placed on Lamisil  250 mg for 90 days

## 2023-07-26 ENCOUNTER — Other Ambulatory Visit: Payer: Medicaid Other

## 2023-07-30 ENCOUNTER — Ambulatory Visit (INDEPENDENT_AMBULATORY_CARE_PROVIDER_SITE_OTHER): Payer: Medicaid Other | Admitting: Podiatry

## 2023-07-30 DIAGNOSIS — B351 Tinea unguium: Secondary | ICD-10-CM

## 2023-09-13 ENCOUNTER — Ambulatory Visit (INDEPENDENT_AMBULATORY_CARE_PROVIDER_SITE_OTHER): Payer: Medicaid Other

## 2023-09-13 DIAGNOSIS — Z91199 Patient's noncompliance with other medical treatment and regimen due to unspecified reason: Secondary | ICD-10-CM

## 2023-09-13 NOTE — Progress Notes (Unsigned)
No Show

## 2023-10-11 ENCOUNTER — Ambulatory Visit (INDEPENDENT_AMBULATORY_CARE_PROVIDER_SITE_OTHER): Payer: Medicaid Other | Admitting: Podiatry

## 2023-10-11 DIAGNOSIS — B351 Tinea unguium: Secondary | ICD-10-CM

## 2023-10-11 NOTE — Progress Notes (Signed)
        HPI: 37 y.o. female presents today for laser nail treatment # 2.  This is Dr. Beverlee Nims patient.  She finished the oral terbinafine without adverse reactions.  She is also using various topical treatments on the toenails to help rid the fungus before next summer.  EXAM: Upon review of the toenails, B/L hallux nails are affected.  There is discoloration, thickening of the nail, distal onycholysis and subungual debris consistent with onychomycosis.  Proximal aspect of nails are clear  A/P: Onychomycosis of toenails.  Number after review of laser safety with the patient, the ten toenails were debrided today in preparation for the laser.  The pinpoint laser nail treatments was utilized on the affected toenails for 2 passes.  Patient tolerated this well.  Will reschedule the patient for laser # 3,  in 1 month.    Clerance Lav, DPM, FACFAS [facility]     [address], [place] [address]

## 2023-11-08 ENCOUNTER — Other Ambulatory Visit: Payer: Medicaid Other

## 2023-11-15 ENCOUNTER — Ambulatory Visit (INDEPENDENT_AMBULATORY_CARE_PROVIDER_SITE_OTHER): Payer: Medicaid Other | Admitting: Podiatrist

## 2023-11-15 DIAGNOSIS — B353 Tinea pedis: Secondary | ICD-10-CM

## 2023-11-15 DIAGNOSIS — B351 Tinea unguium: Secondary | ICD-10-CM

## 2023-11-15 MED ORDER — CLOTRIMAZOLE-BETAMETHASONE 1-0.05 % EX CREA: 1.0000 | TOPICAL_CREAM | Freq: Every day | CUTANEOUS | 2 refills | Status: DC

## 2023-11-15 MED ORDER — TERBINAFINE HCL 250 MG PO TABS: 250.0000 mg | ORAL_TABLET | Freq: Every day | ORAL | 0 refills | Status: DC

## 2023-11-15 NOTE — Progress Notes (Signed)
        HPI: 37 y.o. female presents today for laser nail treatment # 3  This is Dr. Beverlee Nims patient.  She finished the oral terbinafine without adverse reactions.  She is having a flare up of redness and blistering around the nails which appears to be a skin fungal reaction.  She relates itching and macerated skin between toes 1,2 bilateral.  No pus or drainage reported.   EXAM: Upon review of the toenails, B/L hallux nails are affected.   Proximal aspect of nails are clear  Redness, and blistering around the toes is noted with macerated tissue between the toes noted.    A/P: Onychomycosis of toenails.  Tinea pedis skin reaction   All safety precautions in place.  Lasering of bilateral hallux nails performed using the pinpointe laser.  Single pass lasering of all toes performed.  Patient tolerated this well.  Will put her on another pulse of lamisil and lotrisone cream. If no improvement in 7 days she is to call.   Will reschedule the patient for laser # 4,  in 1 month.

## 2023-12-13 ENCOUNTER — Other Ambulatory Visit: Payer: Medicaid Other

## 2023-12-25 DIAGNOSIS — Z419 Encounter for procedure for purposes other than remedying health state, unspecified: Secondary | ICD-10-CM | POA: Diagnosis not present

## 2024-01-05 DIAGNOSIS — Z419 Encounter for procedure for purposes other than remedying health state, unspecified: Secondary | ICD-10-CM | POA: Diagnosis not present

## 2024-01-17 ENCOUNTER — Ambulatory Visit (INDEPENDENT_AMBULATORY_CARE_PROVIDER_SITE_OTHER): Payer: Medicaid Other | Admitting: Podiatrist

## 2024-01-17 DIAGNOSIS — B351 Tinea unguium: Secondary | ICD-10-CM

## 2024-01-17 MED ORDER — CLOTRIMAZOLE-BETAMETHASONE 1-0.05 % EX CREA: 1.0000 | TOPICAL_CREAM | Freq: Every day | CUTANEOUS | 2 refills | Status: DC

## 2024-01-17 NOTE — Progress Notes (Signed)
        HPI: 38 y.o. female presents today for laser nail treatment # 4  This is Dr. Beverlee Nims patient.  She finished the oral terbinafine without adverse reactions. She had a previous flare up of redness and blistering around the nails which improved with another treatment of oral terbinafine and lotrisone cream.    Upon review of the toenails, B/L hallux nails are affected.   Proximal aspect of nails are clear    A/P: Onychomycosis of toenails.  Tinea pedis skin reaction- resolved.    All safety precautions in place.  All nails were filed thin with sterile burr.  Lasering of bilateral hallux nails performed using the pinpointe laser.  Single pass lasering of all toes performed.  Patient tolerated this well.  Rx for lotrisone with 2 refills called in for prn use.    Will reschedule the patient for laser # 5,  in 6 weeks.

## 2024-01-22 ENCOUNTER — Ambulatory Visit (HOSPITAL_BASED_OUTPATIENT_CLINIC_OR_DEPARTMENT_OTHER): Payer: Medicaid Other | Admitting: Family Medicine

## 2024-01-22 VITALS — BP 115/80 | HR 75 | Temp 97.4°F | Ht 66.0 in | Wt 159.2 lb

## 2024-01-22 DIAGNOSIS — Z124 Encounter for screening for malignant neoplasm of cervix: Secondary | ICD-10-CM

## 2024-01-22 DIAGNOSIS — K649 Unspecified hemorrhoids: Secondary | ICD-10-CM

## 2024-01-22 DIAGNOSIS — Z0000 Encounter for general adult medical examination without abnormal findings: Secondary | ICD-10-CM

## 2024-01-22 NOTE — Progress Notes (Signed)
New Patient Office Visit  Subjective   Patient ID: Kim Salazar, female    DOB: 1986-02-26  Age: 38 y.o. MRN: [id number]  CC:  Chief Complaint  Patient presents with   Hemorrhoids    Pt. Stats that she has concerns about Hemorrhoids.     HPI Kim Salazar presents to establish care Last PCP - none  Hemorrhoids: initially became an issue after having her daughter about 13 years ago. Has been managing with conservative measures, has been gradually worsening recently. Due to concerns, she has questions about possible surgical removal/treatment.  Denies any issues during prior pregnancies - no issues with blood sugar or blood pressure.  Patient is originally from [place]. Has lived here for about 13 years. Patient works at home remotely. She enjoys cooking, being outside, swimming, reading.  Outpatient Encounter Medications as of 01/22/2024  Medication Sig   Cetirizine HCl (ZYRTEC ALLERGY) 10 MG CAPS Take by mouth.   Multiple Vitamin (MULTIVITAMIN) capsule Take 1 capsule by mouth daily.   loratadine (CLARITIN) 10 MG tablet Take 10 mg by mouth daily.   [DISCONTINUED] BENZOYL PEROXIDE 5 % external wash Apply topically. (Patient not taking: Reported on 01/22/2024)   [DISCONTINUED] clindamycin (CLEOCIN T) 1 % lotion Apply topically every morning. (Patient not taking: Reported on 01/22/2024)   [DISCONTINUED] clotrimazole-betamethasone (LOTRISONE) cream Apply 1 Application topically daily. (Patient not taking: Reported on 01/22/2024)   [DISCONTINUED] clotrimazole-betamethasone (LOTRISONE) cream Apply 1 Application topically daily. (Patient not taking: Reported on 01/22/2024)   [DISCONTINUED] doxycycline (MONODOX) 100 MG capsule Take by mouth daily as needed. (Patient not taking: Reported on 01/22/2024)   [DISCONTINUED] HIBICLENS 4 % external liquid Apply topically daily. (Patient not taking: Reported on 01/22/2024)   [DISCONTINUED] medroxyPROGESTERone (DEPO-PROVERA) 150 MG/ML injection  (Patient not  taking: Reported on 01/22/2024)   [DISCONTINUED] naproxen (NAPROSYN) 500 MG tablet Take 1 tablet (500 mg total) by mouth 2 (two) times daily. (Patient not taking: Reported on 01/22/2024)   [DISCONTINUED] terbinafine (LAMISIL) 250 MG tablet Take 1 tablet (250 mg total) by mouth daily. (Patient not taking: Reported on 01/22/2024)   [DISCONTINUED] terbinafine (LAMISIL) 250 MG tablet Take 1 tablet (250 mg total) by mouth daily. (Patient not taking: Reported on 01/22/2024)   [DISCONTINUED] tretinoin (RETIN-A) 0.05 % cream Apply topically at bedtime. (Patient not taking: Reported on 01/22/2024)   No facility-administered encounter medications on file as of 01/22/2024.    Past Medical History:  Diagnosis Date   Medical history non-contributory     Past Surgical History:  Procedure Laterality Date   adenoid removal     TONSILLECTOMY      Family History  Problem Relation Age of Onset   COPD Maternal Grandmother    Diabetes Maternal Grandmother    Healthy Mother    Clotting disorder Father     Social History   Socioeconomic History   Marital status: Single    Spouse name: Not on file   Number of children: Not on file   Years of education: Not on file   Highest education level: Not on file  Occupational History   Not on file  Tobacco Use   Smoking status: Never   Smokeless tobacco: Former  Substance and Sexual Activity   Alcohol use: Not Currently   Drug use: Not Currently    Types: Marijuana   Sexual activity: Yes    Birth control/protection: None  Other Topics Concern   Not on file  Social History Narrative   Not on file  Social Drivers of Corporate investment banker Strain: Not on file  Food Insecurity: No Food Insecurity (12/06/2020)   Hunger Vital Sign    Worried About Running Out of Food in the Last Year: Never true    Ran Out of Food in the Last Year: Never true  Transportation Needs: No Transportation Needs (12/06/2020)   PRAPARE - Scientist, research (physical sciences) (Medical): No    Lack of Transportation (Non-Medical): No  Physical Activity: Not on file  Stress: Not on file  Social Connections: Not on file  Intimate Partner Violence: Not on file    Objective   BP 115/80   Pulse 75   Temp (!) 97.4 F (36.3 C) (Oral)   Ht 5\' 6"  (1.676 m)   Wt 159 lb 3.2 oz (72.2 kg)   LMP 01/13/2024 (Exact Date)   SpO2 100%   BMI 25.70 kg/m   Physical Exam  38 year old female in no acute distress Cardiovascular exam with regular rate and rhythm, no murmur appreciated Lungs clear to auscultation bilaterally  Assessment & Plan:   Hemorrhoids, unspecified hemorrhoid type Assessment & Plan: Due to ongoing concerns, can refer to specialist for further evaluation and recommendations, consideration of procedural intervention  Orders: -     Ambulatory referral to General Surgery  Wellness examination -     CBC with Differential/Platelet; Future -     Comprehensive metabolic panel; Future -     Lipid panel; Future -     TSH Rfx on Abnormal to Free T4; Future  Cervical cancer screening -     Ambulatory referral to Obstetrics / Gynecology  Return in about 2 months (around 03/21/2024) for CPE with fasting labs 1 week prior.    ___________________________________________ Milano Rosevear de Peru, MD, ABFM, CAQSM Primary Care and Sports Medicine [facility]

## 2024-01-22 NOTE — Assessment & Plan Note (Signed)
Due to ongoing concerns, can refer to specialist for further evaluation and recommendations, consideration of procedural intervention

## 2024-01-22 NOTE — Patient Instructions (Signed)
  Medication Instructions:  Your physician recommends that you continue on your current medications as directed. Please refer to the Current Medication list given to you today. --If you need a refill on any your medications before your next appointment, please call your pharmacy first. If no refills are authorized on file call the office.-- Lab Work: Your physician has recommended that you have lab work today: 1 week before  If you have labs (blood work) drawn today and your tests are completely normal, you will receive your results via MyChart message OR a phone call from our staff.  Please ensure you check your voicemail in the event that you authorized detailed messages to be left on a delegated number. If you have any lab test that is abnormal or we need to change your treatment, we will call you to review the results.  Referrals/Procedures/Imaging: obgyn  Follow-Up: Your next appointment:   Your physician recommends that you schedule a follow-up appointment in: 2 months physical  with Dr. de Peru  You will receive a text message or e-mail with a link to a survey about your care and experience with [place] today! We would greatly appreciate your feedback!   Thanks for letting us be apart of your health journey!!  Primary Care and Sports Medicine   Dr. Ceasar Mons Peru   We encourage you to activate your patient portal called "MyChart".  Sign up information is provided on this After Visit Summary.  MyChart is used to connect with patients for Virtual Visits (Telemedicine).  Patients are able to view lab/test results, encounter notes, upcoming appointments, etc.  Non-urgent messages can be sent to your provider as well. To learn more about what you can do with MyChart, please visit --  [email or web address].

## 2024-01-25 DIAGNOSIS — Z419 Encounter for procedure for purposes other than remedying health state, unspecified: Secondary | ICD-10-CM | POA: Diagnosis not present

## 2024-02-22 DIAGNOSIS — Z419 Encounter for procedure for purposes other than remedying health state, unspecified: Secondary | ICD-10-CM | POA: Diagnosis not present

## 2024-02-28 ENCOUNTER — Ambulatory Visit (INDEPENDENT_AMBULATORY_CARE_PROVIDER_SITE_OTHER): Payer: Medicaid Other

## 2024-02-28 ENCOUNTER — Other Ambulatory Visit: Payer: Medicaid Other

## 2024-02-28 DIAGNOSIS — B351 Tinea unguium: Secondary | ICD-10-CM

## 2024-02-28 NOTE — Progress Notes (Signed)
 Patient presents today for the 5 laser treatment. Diagnosed with mycotic nail infection by Dr. Charlsie Merles.   Toenail most affected 1st bilaterally.  All other systems are negative. Patient does report significant improvement.   Nails were filed thin. Laser therapy was administered to 1st toenails bilaterally and patient tolerated the treatment well. All safety precautions were in place.   Single laser pass was done on non-affected nails.   Follow up in 8 weeks for laser # 6.

## 2024-03-23 ENCOUNTER — Encounter (HOSPITAL_BASED_OUTPATIENT_CLINIC_OR_DEPARTMENT_OTHER): Payer: Self-pay | Admitting: Family Medicine

## 2024-03-30 ENCOUNTER — Encounter (HOSPITAL_BASED_OUTPATIENT_CLINIC_OR_DEPARTMENT_OTHER): Payer: Self-pay | Admitting: Certified Nurse Midwife

## 2024-04-04 DIAGNOSIS — Z419 Encounter for procedure for purposes other than remedying health state, unspecified: Secondary | ICD-10-CM | POA: Diagnosis not present

## 2024-04-20 ENCOUNTER — Other Ambulatory Visit

## 2024-04-20 ENCOUNTER — Encounter (HOSPITAL_BASED_OUTPATIENT_CLINIC_OR_DEPARTMENT_OTHER): Payer: Self-pay | Admitting: Family Medicine

## 2024-04-21 ENCOUNTER — Ambulatory Visit: Admitting: Podiatrist

## 2024-04-21 ENCOUNTER — Other Ambulatory Visit (HOSPITAL_BASED_OUTPATIENT_CLINIC_OR_DEPARTMENT_OTHER): Payer: Self-pay | Admitting: *Deleted

## 2024-04-21 DIAGNOSIS — Z0100 Encounter for examination of eyes and vision without abnormal findings: Secondary | ICD-10-CM

## 2024-04-21 DIAGNOSIS — L700 Acne vulgaris: Secondary | ICD-10-CM

## 2024-04-21 DIAGNOSIS — B351 Tinea unguium: Secondary | ICD-10-CM

## 2024-04-21 NOTE — Progress Notes (Signed)
        HPI: 38 y.o. female presents today for laser nail treatment # 6  This is Dr. Merle Starcher patient.  She finished the oral terbinafine  without adverse reactions. She had a laser treatment about a month ago and noted no improvement in the appearance of the nails.   Upon review of the toenails, B/L hallux nails are affected.   Proximal aspect of nails are clear  A/P: Onychomycosis of toenails.   All safety precautions in place.  All nails were filed thin with sterile burr.  Lasering of bilateral hallux nails performed using the pinpointe laser.  Single pass lasering of all toes performed.  Patient tolerated this well.    Will reschedule the patient for a final laser,  in 6 weeks.

## 2024-04-30 ENCOUNTER — Other Ambulatory Visit (HOSPITAL_BASED_OUTPATIENT_CLINIC_OR_DEPARTMENT_OTHER): Payer: Self-pay | Admitting: *Deleted

## 2024-04-30 DIAGNOSIS — Z0100 Encounter for examination of eyes and vision without abnormal findings: Secondary | ICD-10-CM

## 2024-05-04 DIAGNOSIS — Z419 Encounter for procedure for purposes other than remedying health state, unspecified: Secondary | ICD-10-CM | POA: Diagnosis not present

## 2024-05-08 ENCOUNTER — Encounter (HOSPITAL_BASED_OUTPATIENT_CLINIC_OR_DEPARTMENT_OTHER): Admitting: Certified Nurse Midwife

## 2024-05-11 ENCOUNTER — Encounter (HOSPITAL_BASED_OUTPATIENT_CLINIC_OR_DEPARTMENT_OTHER): Admitting: Certified Nurse Midwife

## 2024-05-20 ENCOUNTER — Other Ambulatory Visit (HOSPITAL_BASED_OUTPATIENT_CLINIC_OR_DEPARTMENT_OTHER): Payer: Self-pay | Admitting: *Deleted

## 2024-05-20 ENCOUNTER — Other Ambulatory Visit (HOSPITAL_COMMUNITY): Payer: Self-pay | Admitting: Family Medicine

## 2024-05-20 DIAGNOSIS — Z0000 Encounter for general adult medical examination without abnormal findings: Secondary | ICD-10-CM

## 2024-05-21 ENCOUNTER — Ambulatory Visit (INDEPENDENT_AMBULATORY_CARE_PROVIDER_SITE_OTHER): Admitting: Family Medicine

## 2024-05-21 VITALS — BP 111/80 | HR 94 | Ht 66.0 in | Wt 161.6 lb

## 2024-05-21 DIAGNOSIS — Z0000 Encounter for general adult medical examination without abnormal findings: Secondary | ICD-10-CM | POA: Insufficient documentation

## 2024-05-21 LAB — TSH RFX ON ABNORMAL TO FREE T4: TSH: 0.643 u[IU]/mL (ref 0.450–4.500)

## 2024-05-21 LAB — COMPREHENSIVE METABOLIC PANEL WITH GFR
ALT: 13 IU/L (ref 0–32)
AST: 18 IU/L (ref 0–40)
Albumin: 4.3 g/dL (ref 3.9–4.9)
Alkaline Phosphatase: 96 IU/L (ref 44–121)
BUN/Creatinine Ratio: 8 — ABNORMAL LOW (ref 9–23)
BUN: 7 mg/dL (ref 6–20)
Bilirubin Total: 0.8 mg/dL (ref 0.0–1.2)
CO2: 21 mmol/L (ref 20–29)
Calcium: 9.3 mg/dL (ref 8.7–10.2)
Chloride: 102 mmol/L (ref 96–106)
Creatinine, Ser: 0.87 mg/dL (ref 0.57–1.00)
Globulin, Total: 2.4 g/dL (ref 1.5–4.5)
Glucose: 89 mg/dL (ref 70–99)
Potassium: 4.2 mmol/L (ref 3.5–5.2)
Sodium: 138 mmol/L (ref 134–144)
Total Protein: 6.7 g/dL (ref 6.0–8.5)
eGFR: 88 mL/min/{1.73_m2} (ref >59)

## 2024-05-21 LAB — CBC WITH DIFFERENTIAL/PLATELET
Basophils Absolute: 0.1 10*3/uL (ref 0.0–0.2)
Basos: 1 %
EOS (ABSOLUTE): 0.4 10*3/uL (ref 0.0–0.4)
Eos: 4 %
Hematocrit: 42.4 % (ref 34.0–46.6)
Hemoglobin: 13.4 g/dL (ref 11.1–15.9)
Immature Grans (Abs): 0 10*3/uL (ref 0.0–0.1)
Immature Granulocytes: 0 %
Lymphocytes Absolute: 2.1 10*3/uL (ref 0.7–3.1)
Lymphs: 20 %
MCH: 30 pg (ref 26.6–33.0)
MCHC: 31.6 g/dL (ref 31.5–35.7)
MCV: 95 fL (ref 79–97)
Monocytes Absolute: 0.8 10*3/uL (ref 0.1–0.9)
Monocytes: 7 %
Neutrophils Absolute: 7.3 10*3/uL — ABNORMAL HIGH (ref 1.4–7.0)
Neutrophils: 68 %
Platelets: 246 10*3/uL (ref 150–450)
RBC: 4.46 x10E6/uL (ref 3.77–5.28)
RDW: 12.8 % (ref 11.7–15.4)
WBC: 10.8 10*3/uL (ref 3.4–10.8)

## 2024-05-21 LAB — LIPID PANEL
Chol/HDL Ratio: 2.5 ratio (ref 0.0–4.4)
Cholesterol, Total: 183 mg/dL (ref 100–199)
HDL: 72 mg/dL (ref >39)
LDL Chol Calc (NIH): 93 mg/dL (ref 0–99)
Triglycerides: 102 mg/dL (ref 0–149)
VLDL Cholesterol Cal: 18 mg/dL (ref 5–40)

## 2024-05-21 NOTE — Assessment & Plan Note (Signed)

## 2024-05-21 NOTE — Progress Notes (Signed)
 Subjective:    CC: Annual Physical Exam  HPI: Kim Salazar is a 38 y.o. presenting for annual physical  I reviewed the past medical history, family history, social history, surgical history, and allergies today and no changes were needed.  Please see the problem list section below in epic for further details.  Past Medical History: Past Medical History:  Diagnosis Date   Medical history non-contributory    Past Surgical History: Past Surgical History:  Procedure Laterality Date   adenoid removal     TONSILLECTOMY     Social History: Social History   Socioeconomic History   Marital status: Single    Spouse name: Not on file   Number of children: Not on file   Years of education: Not on file   Highest education level: Not on file  Occupational History   Not on file  Tobacco Use   Smoking status: Never   Smokeless tobacco: Former  Substance and Sexual Activity   Alcohol use: Not Currently   Drug use: Not Currently    Types: Marijuana   Sexual activity: Yes    Birth control/protection: None  Other Topics Concern   Not on file  Social History Narrative   Not on file   Social Drivers of Health   Financial Resource Strain: Not on file  Food Insecurity: No Food Insecurity (12/06/2020)   Hunger Vital Sign    Worried About Running Out of Food in the Last Year: Never true    Ran Out of Food in the Last Year: Never true  Transportation Needs: No Transportation Needs (12/06/2020)   PRAPARE - Administrator, Civil Service (Medical): No    Lack of Transportation (Non-Medical): No  Physical Activity: Not on file  Stress: Not on file  Social Connections: Not on file   Family History: Family History  Problem Relation Age of Onset   COPD Maternal Grandmother    Diabetes Maternal Grandmother    Healthy Mother    Clotting disorder Father    Allergies: Allergies  Allergen Reactions   Codeine Hives   Amoxicillin Rash   Keflex [Cephalexin] Hives and  Itching   Latex Rash   Penicillins Rash    Has patient had a PCN reaction causing immediate rash, facial/tongue/throat swelling, SOB or lightheadedness with hypotension: Yes Has patient had a PCN reaction causing severe rash involving mucus membranes or skin necrosis: Yes Has patient had a PCN reaction that required hospitalization: No Has patient had a PCN reaction occurring within the last 10 years: No If all of the above answers are "NO", then may proceed with Cephalosporin use.    Medications: See med rec.  Review of Systems: No headache, visual changes, nausea, vomiting, diarrhea, constipation, dizziness, abdominal pain, skin rash, fevers, chills, night sweats, swollen lymph nodes, weight loss, chest pain, body aches, joint swelling, muscle aches, shortness of breath, mood changes, visual or auditory hallucinations.  Objective:    BP (!) 152/100   Pulse 94   Ht 5\' 6"  (1.676 m)   Wt 161 lb 9.6 oz (73.3 kg)   LMP 05/04/2024   SpO2 99%   BMI 26.08 kg/m   General: Well Developed, well nourished, and in no acute distress. Neuro: Alert and oriented x3, extra-ocular muscles intact, sensation grossly intact. Cranial nerves II through XII are intact, motor, sensory, and coordinative functions are all intact. HEENT: Normocephalic, atraumatic, pupils equal round reactive to light, neck supple, no masses, no lymphadenopathy, thyroid nonpalpable. Oropharynx, nasopharynx, external ear  canals are unremarkable. Skin: Warm and dry, no rashes noted. Cardiac: Regular rate and rhythm, no murmurs rubs or gallops. Respiratory: Clear to auscultation bilaterally. Not using accessory muscles, speaking in full sentences. Abdominal: Soft, nontender, nondistended, positive bowel sounds, no masses, no organomegaly. Musculoskeletal: Shoulder, elbow, wrist, hip, knee, ankle stable, and with full range of motion.  Impression and Recommendations:    Wellness examination Assessment & Plan: Routine HCM labs  reviewed. HCM reviewed/discussed. Anticipatory guidance regarding healthy weight, lifestyle and choices given. Recommend healthy diet.  Recommend approximately 150 minutes/week of moderate intensity exercise Recommend regular dental and vision exams Always use seatbelt/lap and shoulder restraints Recommend using smoke alarms and checking batteries at least twice a year Recommend using sunscreen when outside Discussed immunization recommendations   Patient also reporting that she has symptoms of irregular menstrual cycles, intermittent sweats/hot flashes.  She is concerned about possible perimenopausal symptoms.  She reports that her mom did go through menopause in her late 31s. Patient has previously been referred to OB/GYN.  Her labs recently were normal.  Recommend that she have further evaluation with OB/GYN regarding the symptoms.  If able to establish with OB/GYN here, may do so.  If they are not accepting new patients, then we can place referral to alternative office in area  Return in about 1 year (around 05/21/2025) for CPE.   ___________________________________________ Aylin Rhoads de [place], MD, ABFM, CAQSM Primary Care and Sports Medicine [facility]

## 2024-05-21 NOTE — Patient Instructions (Signed)
  Medication Instructions:  Your physician recommends that you continue on your current medications as directed. Please refer to the Current Medication list given to you today. --If you need a refill on any your medications before your next appointment, please call your pharmacy first. If no refills are authorized on file call the office.-- Lab Work: Your physician has recommended that you have lab work today: no If you have labs (blood work) drawn today and your tests are completely normal, you will receive your results via MyChart message OR a phone call from our staff.  Please ensure you check your voicemail in the event that you authorized detailed messages to be left on a delegated number. If you have any lab test that is abnormal or we need to change your treatment, we will call you to review the results.  Referrals/Procedures/Imaging: no  Follow-Up: Your next appointment:   Your physician recommends that you schedule a follow-up appointment in: 1 year with Dr. de [place]  You will receive a text message or e-mail with a link to a survey about your care and experience with [place] today! We would greatly appreciate your feedback!   Thanks for letting us be apart of your health journey!!  Primary Care and Sports Medicine   Dr. Ceasar Mons [place]   We encourage you to activate your patient portal called "MyChart".  Sign up information is provided on this After Visit Summary.  MyChart is used to connect with patients for Virtual Visits (Telemedicine).  Patients are able to view lab/test results, encounter notes, upcoming appointments, etc.  Non-urgent messages can be sent to your provider as well. To learn more about what you can do with MyChart, please visit --  [email or web address].

## 2024-05-31 ENCOUNTER — Encounter (HOSPITAL_BASED_OUTPATIENT_CLINIC_OR_DEPARTMENT_OTHER): Payer: Self-pay | Admitting: Family Medicine

## 2024-06-04 DIAGNOSIS — Z419 Encounter for procedure for purposes other than remedying health state, unspecified: Secondary | ICD-10-CM | POA: Diagnosis not present

## 2024-07-04 DIAGNOSIS — Z419 Encounter for procedure for purposes other than remedying health state, unspecified: Secondary | ICD-10-CM | POA: Diagnosis not present

## 2024-07-10 ENCOUNTER — Other Ambulatory Visit

## 2024-07-20 ENCOUNTER — Encounter (HOSPITAL_BASED_OUTPATIENT_CLINIC_OR_DEPARTMENT_OTHER): Payer: Self-pay | Admitting: *Deleted

## 2024-07-28 ENCOUNTER — Other Ambulatory Visit

## 2024-07-28 ENCOUNTER — Other Ambulatory Visit (HOSPITAL_BASED_OUTPATIENT_CLINIC_OR_DEPARTMENT_OTHER): Payer: Self-pay | Admitting: *Deleted

## 2024-07-28 DIAGNOSIS — Z124 Encounter for screening for malignant neoplasm of cervix: Secondary | ICD-10-CM

## 2024-08-04 DIAGNOSIS — Z419 Encounter for procedure for purposes other than remedying health state, unspecified: Secondary | ICD-10-CM | POA: Diagnosis not present

## 2024-08-05 DIAGNOSIS — K600 Acute anal fissure: Secondary | ICD-10-CM | POA: Diagnosis not present

## 2024-08-11 ENCOUNTER — Other Ambulatory Visit

## 2024-09-04 ENCOUNTER — Other Ambulatory Visit

## 2024-09-04 DIAGNOSIS — Z419 Encounter for procedure for purposes other than remedying health state, unspecified: Secondary | ICD-10-CM | POA: Diagnosis not present

## 2024-09-11 ENCOUNTER — Ambulatory Visit (INDEPENDENT_AMBULATORY_CARE_PROVIDER_SITE_OTHER): Payer: Self-pay

## 2024-09-11 DIAGNOSIS — B351 Tinea unguium: Secondary | ICD-10-CM

## 2024-09-11 NOTE — Progress Notes (Signed)
 Patient presents today for the final laser treatment. Diagnosed with mycotic nail infection by Dr. Magdalen.   Toenail most affected bilateral 1st hallux nails. Patient reports significant improvement in overall appearance of nails. The nails are primarily clear with no yellowing noted today.  All other systems are negative.  Nails were filed thin. Laser therapy was administered to bilateral 1st toenails and single laser pass administered to bilateral 2nd-5th and patient tolerated the treatment well. All safety precautions were in place.   Post treatment instructions reviewed and provided to patient. Patient had no questions regarding plan of care.   Follow up in 3 months for laser progress follow up with Dr. Magdalen.

## 2024-10-12 DIAGNOSIS — K600 Acute anal fissure: Secondary | ICD-10-CM | POA: Diagnosis not present

## 2024-10-12 DIAGNOSIS — K644 Residual hemorrhoidal skin tags: Secondary | ICD-10-CM | POA: Diagnosis not present

## 2024-11-24 ENCOUNTER — Encounter: Payer: Self-pay | Admitting: Physician Assistant

## 2024-11-24 ENCOUNTER — Ambulatory Visit: Admitting: Physician Assistant

## 2024-11-24 VITALS — BP 122/80 | HR 83

## 2024-11-24 DIAGNOSIS — L700 Acne vulgaris: Secondary | ICD-10-CM

## 2024-11-24 DIAGNOSIS — L905 Scar conditions and fibrosis of skin: Secondary | ICD-10-CM

## 2024-11-24 MED ORDER — TRETINOIN 0.1 % EX CREA: TOPICAL_CREAM | Freq: Every day | CUTANEOUS | 3 refills | Status: AC

## 2024-11-24 MED ORDER — BENZOYL PEROXIDE 10 % EX LIQD: 1.0000 [IU] | Freq: Every morning | CUTANEOUS | 5 refills | Status: AC

## 2024-11-24 MED ORDER — CLINDAMYCIN PHOSPHATE 1 % EX LOTN: TOPICAL_LOTION | Freq: Every day | CUTANEOUS | 5 refills | Status: AC

## 2024-11-24 NOTE — Progress Notes (Signed)
   New Patient Visit   Subjective  Kim Salazar is a 38 y.o. female NEW PATIENT who presents for the following: acne  Pt here for acne on the face. She currently uses [facility] which has wash, day and night cream. She has seen very minimum improvement. She states her acne is worse with her cycle.  The following portions of the chart were reviewed this encounter and updated as appropriate: medications, allergies, medical history  Review of Systems:  No other skin or systemic complaints except as noted in HPI or Assessment and Plan.  Objective  Well appearing patient in no apparent distress; mood and affect are within normal limits.  A focused examination was performed of the following areas: face  Relevant exam findings are noted in the Assessment and Plan.    Assessment & Plan   ACNE VULGARIS Exam: Scars, open comedones and resolving papules on face   Chronic and persistent condition with duration or expected duration over one year. Condition is bothersome/symptomatic for patient. Currently flared.   Treatment Plan: Wash face with bpo wash in am Apply clindamycin lotion qam Apply tretinoin 0.1% starting every 3rd night and work up to nightly  Wash face with gentle wash at night     ACNE VULGARIS   ACNE SCARRING    Return in about 3 months (around 02/22/2025) for ACNE.  I, Darice Smock, CMA, am acting as scribe for [employer], PA-C.   Documentation: I have reviewed the above documentation for accuracy and completeness, and I agree with the above.  Soila Printup K, PA-C

## 2024-11-24 NOTE — Patient Instructions (Addendum)
 Treatment Plan: Wash face with benzoyl peroxide wash in am Apply clindamycin lotion every morning Apply tretinoin 0.1% starting every 3rd night and work up to nightly  Wash face with gentle wash at night    Important Information   Due to recent changes in healthcare laws, you may see results of your pathology and/or laboratory studies on MyChart before the doctors have had a chance to review them. We understand that in some cases there may be results that are confusing or concerning to you. Please understand that not all results are received at the same time and often the doctors may need to interpret multiple results in order to provide you with the best plan of care or course of treatment. Therefore, we ask that you please give us  2 business days to thoroughly review all your results before contacting the office for clarification. Should we see a critical lab result, you will be contacted sooner.     If You Need Anything After Your Visit   If you have any questions or concerns for your doctor, please call our main line at [phone]. If no one answers, please leave a voicemail as directed and we will return your call as soon as possible. Messages left after 4 pm will be answered the following business day.    You may also send us  a message via MyChart. We typically respond to MyChart messages within 1-2 business days.  For prescription refills, please ask your pharmacy to contact our office. Our fax number is [phone].  If you have an urgent issue when the clinic is closed that cannot wait until the next business day, you can page your doctor at the number below.     Please note that while we do our best to be available for urgent issues outside of office hours, we are not available 24/7.    If you have an urgent issue and are unable to reach us , you may choose to seek medical care at your doctor's office, retail clinic, urgent care center, or emergency room.   If you have a  medical emergency, please immediately call 911 or go to the emergency department. In the event of inclement weather, please call our main line at [phone] for an update on the status of any delays or closures.  Dermatology Medication Tips: Please keep the boxes that topical medications come in in order to help keep track of the instructions about where and how to use these. Pharmacies typically print the medication instructions only on the boxes and not directly on the medication tubes.   If your medication is too expensive, please contact our office at [phone] or send us  a message through MyChart.    We are unable to tell what your co-pay for medications will be in advance as this is different depending on your insurance coverage. However, we may be able to find a substitute medication at lower cost or fill out paperwork to get insurance to cover a needed medication.    If a prior authorization is required to get your medication covered by your insurance company, please allow us  1-2 business days to complete this process.   Drug prices often vary depending on where the prescription is filled and some pharmacies may offer cheaper prices.   The website www.goodrx.com contains coupons for medications through different pharmacies. The prices here do not account for what the cost may be with help from insurance (it may be cheaper with your insurance), but the website can give  you the price if you did not use any insurance.  - You can print the associated coupon and take it with your prescription to the pharmacy.  - You may also stop by our office during regular business hours and pick up a GoodRx coupon card.  - If you need your prescription sent electronically to a different pharmacy, notify our office through [facility] or by phone at [phone]

## 2024-12-10 ENCOUNTER — Ambulatory Visit: Admitting: Podiatry

## 2024-12-10 ENCOUNTER — Encounter: Payer: Self-pay | Admitting: Podiatry

## 2024-12-10 DIAGNOSIS — L600 Ingrowing nail: Secondary | ICD-10-CM

## 2024-12-10 NOTE — Patient Instructions (Signed)

## 2024-12-10 NOTE — Progress Notes (Signed)
 Subjective:   Patient ID: Kim Salazar, female   DOB: 38 y.o.   MRN: [id number]   HPI Patient presents stating she has developed pain in the left big toenail and states it feels like it is ingrown.  Overall doing well with previous fungal infection neuro   ROS      Objective:  Physical Exam  Vascular status intact incurvated medial border left toe tender when pressed with buildup and abnormal structure to the medial nailbed     Assessment:  Ingrown toenail deformity left hallux medial border     Plan:  H&P reviewed and due to the discomfort and inflammation I have recommended correction explained procedure risk the patient and she signed consent form understanding recovery and risk.  Today I infiltrated the left big toe 60 mg like Marcaine mixture sterile prep done and using sterile instrumentation removed the medial border exposed matrix applied phenol 3 applications 30 seconds followed by alcohol lavage sterile dressing gave instructions on soaks wear dressing 24 hours take it off earlier if throbbing were to occur and encouraged to call questions concerns which may arise

## 2024-12-11 ENCOUNTER — Ambulatory Visit: Admitting: Podiatry

## 2024-12-30 ENCOUNTER — Encounter: Payer: Self-pay | Admitting: Podiatry

## 2024-12-30 ENCOUNTER — Ambulatory Visit: Admitting: Podiatry

## 2024-12-30 DIAGNOSIS — L03032 Cellulitis of left toe: Secondary | ICD-10-CM

## 2024-12-30 MED ORDER — DOXYCYCLINE HYCLATE 100 MG PO TABS: 100.0000 mg | ORAL_TABLET | Freq: Two times a day (BID) | ORAL | 1 refills | Status: AC

## 2024-12-30 NOTE — Progress Notes (Signed)
 Subjective:   Patient ID: Kim Salazar, female   DOB: 39 y.o.   MRN: [id number]   HPI Patient presents stating she is concerned about some discomfort in the left big toenail bed where surgery was done several weeks ago and states that it is been sore and she wants it checked neuro   ROS      Objective:  Physical Exam  Vascular status found to be intact good digital perfusion was noted medial border of the left hallux found to be mildly inflamed local no proximal edema erythema drainage noted     Assessment:  Local paronychia infection left big toe medial border     Plan:  H&P reviewed and at this point I have recommended antibiotics doxycycline  soaks and bandage usage.  If it continues to persist we may need to open the area up and drain it but at this point I am very hopeful antibiotic will work for this condition

## 2025-01-11 ENCOUNTER — Ambulatory Visit (HOSPITAL_BASED_OUTPATIENT_CLINIC_OR_DEPARTMENT_OTHER): Admitting: Family Medicine

## 2025-01-15 ENCOUNTER — Telehealth (HOSPITAL_BASED_OUTPATIENT_CLINIC_OR_DEPARTMENT_OTHER): Payer: Self-pay | Admitting: Family Medicine

## 2025-01-15 DIAGNOSIS — Z113 Encounter for screening for infections with a predominantly sexual mode of transmission: Secondary | ICD-10-CM

## 2025-01-15 NOTE — Telephone Encounter (Signed)
 Patient would rather be seen instead of virtual but would like labs done for STD please advise thank you

## 2025-01-15 NOTE — Telephone Encounter (Signed)
 Pt's appt was rescheduled. Please advise on the message about labwork if this would be okay prior to pt's appt.

## 2025-01-18 ENCOUNTER — Ambulatory Visit (HOSPITAL_BASED_OUTPATIENT_CLINIC_OR_DEPARTMENT_OTHER): Admitting: Family Medicine

## 2025-01-19 NOTE — Telephone Encounter (Signed)
 Mychart sent to pt.

## 2025-01-20 NOTE — Addendum Note (Signed)
 Addended by: DE CUBA, RJ J on: 01/20/2025 12:58 PM   Modules accepted: Orders

## 2025-01-20 NOTE — Telephone Encounter (Signed)
 Pt does want to proceed with getting labs done if orders can be placed, I will let pt know she can come at her convenience.

## 2025-01-28 ENCOUNTER — Ambulatory Visit (HOSPITAL_BASED_OUTPATIENT_CLINIC_OR_DEPARTMENT_OTHER): Payer: Self-pay | Admitting: Family Medicine

## 2025-01-28 LAB — SYPHILIS: RPR W/REFLEX TO RPR TITER AND TREPONEMAL ANTIBODIES, TRADITIONAL SCREENING AND DIAGNOSIS ALGORITHM: RPR Ser Ql: NONREACTIVE

## 2025-01-28 LAB — HIV ANTIBODY (ROUTINE TESTING W REFLEX): HIV Screen 4th Generation wRfx: NONREACTIVE

## 2025-02-08 ENCOUNTER — Ambulatory Visit (HOSPITAL_BASED_OUTPATIENT_CLINIC_OR_DEPARTMENT_OTHER): Admitting: Family Medicine

## 2025-03-02 ENCOUNTER — Ambulatory Visit: Admitting: Physician Assistant

## 2025-03-11 ENCOUNTER — Encounter (HOSPITAL_BASED_OUTPATIENT_CLINIC_OR_DEPARTMENT_OTHER): Payer: Self-pay | Admitting: Obstetrics and Gynecology
